# Patient Record
Sex: Female | Born: 1945 | Race: White | Hispanic: No | Marital: Single | Smoking: Never smoker
Health system: Southern US, Community
[De-identification: ages and names within clinical notes are randomized; demographics above are authoritative.]

## PROBLEM LIST (undated history)

## (undated) DIAGNOSIS — F329 Major depressive disorder, single episode, unspecified: Secondary | ICD-10-CM

## (undated) DIAGNOSIS — E079 Disorder of thyroid, unspecified: Secondary | ICD-10-CM

## (undated) DIAGNOSIS — F32A Depression, unspecified: Secondary | ICD-10-CM

## (undated) DIAGNOSIS — F419 Anxiety disorder, unspecified: Secondary | ICD-10-CM

## (undated) DIAGNOSIS — M1990 Unspecified osteoarthritis, unspecified site: Secondary | ICD-10-CM

## (undated) DIAGNOSIS — I10 Essential (primary) hypertension: Secondary | ICD-10-CM

## (undated) HISTORY — DX: Unspecified osteoarthritis, unspecified site: M19.90

## (undated) HISTORY — DX: Disorder of thyroid, unspecified: E07.9

## (undated) HISTORY — DX: Anxiety disorder, unspecified: F41.9

## (undated) HISTORY — DX: Essential (primary) hypertension: I10

---

## 2008-07-17 ENCOUNTER — Ambulatory Visit: Payer: Self-pay | Admitting: Vascular Surgery

## 2008-09-12 ENCOUNTER — Ambulatory Visit: Payer: Self-pay | Admitting: *Deleted

## 2010-12-22 NOTE — Procedures (Signed)
DUPLEX DEEP VENOUS EXAM - LOWER EXTREMITY   INDICATION:  Bilateral leg edema and pain.   HISTORY:  Edema:  Yes.  Trauma/Surgery:  No.  Pain:  Yes.  PE:  No.  Previous DVT:  No.  Anticoagulants:  None.  Other:   DUPLEX EXAM:                CFV   SFV   PopV  PTV    GSV                R  L  R  L  R  L  R   L  R  L  Thrombosis    0  0  0  0  0  0  0   0  0  0  Spontaneous   +  +  +  +  +  +  +   +  +  +  Phasic        +  +  +  +  +  +  +   +  +  +  Augmentation  +  +  +  +  +  +  +   +  +  +  Compressible  +  +  +  +  +  +  +   +  +  +  Competent     +  +  +  +  +  +  +   +  +  +   Legend:  + - yes  o - no  p - partial  D - decreased   IMPRESSION:  No evidence of DVT or reflux noted in bilateral legs.    _____________________________  P. Liliane Bade, M.D.   MG/MEDQ  D:  09/12/2008  T:  09/12/2008  Job:  [id number]

## 2010-12-22 NOTE — Consult Note (Signed)
VASCULAR SURGERY CONSULTATION   Christy Luna, Christy Luna  DOB:  [birth year]                                       09/12/2008  KGMWN#:[id number]   REFERRING PHYSICIAN:  Dr. Janace Hoard, [facility]   REFERRAL DIAGNOSIS:  Bilateral lower extremity edema.   HISTORY:  The patient is a 65 year old female who has been struggling  with severe poorly-controlled lower extremity edema for several months.  She has had several episodes of severe edema associated with weeping of  the skin and blistering.  This has been associated with cellulitis.   Her friend who is accompanying her at this time has photographs which do  reveal extensive lower extremity edema.   Venous evaluation by Doppler reveals no evidence of DVT or reflux in the  deep vessels.   The patient denies a history of known DVT or anticoagulant therapy.   PAST MEDICAL HISTORY:  Unremarkable.  Specifically negative for  diabetes, hypertension, heart disease, congestive heart failure or  previous venous thrombosis.   MEDICATIONS:  1. Lasix 20 mg q. a.m.  2. Augmentin 875 mg b.i.d.  3. Hydrocodone 500 mg q.4 h p.r.n.   ALLERGIES:  Sulfa.   SOCIAL HISTORY:  The patient is single.  She does not smoke or use  alcohol products.   REVIEW OF SYSTEMS:  This is noted for weight gain associated with fluid  retention.  Mild to moderate abdominal discomfort occasionally.  Chronic  leg pain associated with edema.   FAMILY HISTORY:  Noncontributory.   PHYSICAL EXAMINATION:  General:  A 65 year old female who appears  approximately her stated age.  No acute distress.  Alert and oriented.  Vital signs:  BP is 166/94, pulse is 84 per minute.  Lower extremity:  Exam reveals 2+ dorsalis pedis and posterior tibial pulses bilaterally.  1+ edema.  No pigmentation.  No ulceration or blistering.   IMPRESSION:  Poorly-controlled bilateral lower extremity edema.   RECOMMENDATIONS:  Continue current regimen of Lasix.  Leg elevation  with  exacerbation of edema.  Fit for venous compression stockings below knee  20-30 mmHg.   Balinda Quails, M.D.  Electronically Signed  PGH/MEDQ  D:  09/19/2008  T:  09/20/2008  Job:  [id number]   cc:   Peyton Najjar, MD

## 2011-02-22 ENCOUNTER — Encounter: Payer: Self-pay | Admitting: Surgery

## 2011-02-23 ENCOUNTER — Encounter (INDEPENDENT_AMBULATORY_CARE_PROVIDER_SITE_OTHER): Payer: Self-pay | Admitting: Vascular Surgery

## 2011-02-23 DIAGNOSIS — M7989 Other specified soft tissue disorders: Secondary | ICD-10-CM

## 2011-02-24 NOTE — Assessment & Plan Note (Signed)
OFFICE VISIT  Christy Luna, Christy Luna DOB:  [birth year]                                       02/23/2011 ZOXWR#:[id number]  Patient presents today for discussion of her lower extremity swelling. She had been seen in our office for a similar complaint in 2010.  At that time she had a formal venous duplex which showed no evidence of DVT and no evidence of reflux.  She has a long history of bilateral lower extremity edema.  This has been well controlled with compression garments.  She is here today with a caregiver and reports that she was having dermatitis prior to being more compliant with compression.  She currently is wearing panty-style bilateral compression garments.  She had resolution of the distal lower extremity swelling with knee-high compression and began sensing more swelling around her knees and thighs and was switched to panty style for this reason.  She has difficulty tolerating the panty style, saying it makes her abdomen feel uncomfortable.  She does have a history of hypertension.  She is single.  She is retired.  She does not smoke or drink alcohol.  Does have a history of premature atherosclerotic disease in her father and venous pathology in her mother.  PHYSICAL EXAMINATION:  A well-developed and well-nourished white female appearing her stated age in no acute distress.  Blood pressure is 161/91, heart rate 87, respirations 18.  She does have some skin discoloration on her feet bilaterally.  She does have palpable 1-2+ dorsalis pedis pulses bilaterally.  She does not have any swelling in her feet and ankles or calves currently.  She does have large thighs and knees, but this does appear to be more subcutaneous tissue versus edema.  I had a long discussion with patient and her caregiver present.  I do not see any significant arterial or venous pathology.  I did explain the importance for continued compression garment usage to control her  edema. I explained that I feel comfortable with either knee high, thigh high, or panty style, whichever is more comfortable for her.  She will try knee-high compression.  I explained that I do not expect that panty style would make any changes in her thighs and abdomen. She will see [place] again on an as-needed basis.    Larina Earthly, M.D. Electronically Signed  TFE/MEDQ  D:  02/23/2011  T:  02/24/2011  Job:  [id number]  cc:   Clydie Braun L. Hal Hope, M.D.

## 2011-09-01 ENCOUNTER — Ambulatory Visit: Payer: Self-pay

## 2011-09-01 DIAGNOSIS — E039 Hypothyroidism, unspecified: Secondary | ICD-10-CM

## 2011-09-01 DIAGNOSIS — I10 Essential (primary) hypertension: Secondary | ICD-10-CM

## 2011-09-01 DIAGNOSIS — E049 Nontoxic goiter, unspecified: Secondary | ICD-10-CM

## 2011-12-01 ENCOUNTER — Ambulatory Visit: Payer: Self-pay | Admitting: Family Medicine

## 2011-12-01 VITALS — BP 167/88 | HR 89 | Temp 97.4°F | Resp 18 | Ht 65.0 in | Wt 180.6 lb

## 2011-12-01 DIAGNOSIS — R300 Dysuria: Secondary | ICD-10-CM

## 2011-12-01 DIAGNOSIS — N760 Acute vaginitis: Secondary | ICD-10-CM

## 2011-12-01 DIAGNOSIS — M549 Dorsalgia, unspecified: Secondary | ICD-10-CM

## 2011-12-01 DIAGNOSIS — R600 Localized edema: Secondary | ICD-10-CM

## 2011-12-01 DIAGNOSIS — I10 Essential (primary) hypertension: Secondary | ICD-10-CM

## 2011-12-01 DIAGNOSIS — R309 Painful micturition, unspecified: Secondary | ICD-10-CM

## 2011-12-01 DIAGNOSIS — E039 Hypothyroidism, unspecified: Secondary | ICD-10-CM

## 2011-12-01 DIAGNOSIS — F411 Generalized anxiety disorder: Secondary | ICD-10-CM

## 2011-12-01 LAB — POCT UA - MICROSCOPIC ONLY
Casts, Ur, LPF, POC: NEGATIVE
Crystals, Ur, HPF, POC: NEGATIVE
RBC, urine, microscopic: NEGATIVE

## 2011-12-01 LAB — POCT URINALYSIS DIPSTICK
Blood, UA: NEGATIVE
Glucose, UA: NEGATIVE
Nitrite, UA: NEGATIVE
Urobilinogen, UA: 0.2

## 2011-12-01 MED ORDER — FLUCONAZOLE 150 MG PO TABS: ORAL_TABLET | ORAL | Status: DC

## 2011-12-01 MED ORDER — CLONAZEPAM 0.5 MG PO TABS: 0.5000 mg | ORAL_TABLET | Freq: Two times a day (BID) | ORAL | Status: DC | PRN

## 2011-12-01 MED ORDER — FUROSEMIDE 20 MG PO TABS: ORAL_TABLET | ORAL | Status: DC

## 2011-12-01 MED ORDER — LISINOPRIL 20 MG PO TABS: 20.0000 mg | ORAL_TABLET | Freq: Every day | ORAL | Status: DC

## 2011-12-01 NOTE — Progress Notes (Signed)
  Subjective:    Patient ID: Christy Luna, female    DOB: 15-Mar-1946, 66 y.o.   MRN: [id number]  HPI Patient presents to clinic for multiple medical issues  Generalized anxiety- much less anxious. Leaving the house more often. Using Klonopin BID             Vaginitis- yeast vaginitis in the past; also using liners that can be an irritant; prn dysuria  Intermittent lower back pain- caregiver states patient walks essentially on her heels.  Denies feet pain Using walker for balance and proprioception                  Review of Systems  Constitutional: Negative for fever and chills.  Genitourinary: Positive for dysuria (intermittant) and vaginal discharge (scant). Negative for urgency and flank pain.       Objective:   Physical Exam  Constitutional: She appears well-developed.  HENT:  Head: Normocephalic and atraumatic.  Neck: Neck supple.  Cardiovascular: Normal rate, regular rhythm and normal heart sounds.   Pulmonary/Chest: Effort normal and breath sounds normal.  Abdominal: Soft. Bowel sounds are normal.  Musculoskeletal: Normal range of motion.  Neurological: She is alert.  Skin:       Trace edema; wearing TED hose  Psychiatric:       Poor cognition     Results for orders placed in visit on 12/01/11  POCT URINALYSIS DIPSTICK      Component Value Range   Color, UA light yellow     Clarity, UA clear     Glucose, UA neg     Bilirubin, UA neg     Ketones, UA neg     Spec Grav, UA <=1.005     Blood, UA neg     pH, UA 5.5     Protein, UA neg     Urobilinogen, UA 0.2     Nitrite, UA neg     Leukocytes, UA small (1+)    POCT UA - MICROSCOPIC ONLY      Component Value Range   WBC, Ur, HPF, POC neg     RBC, urine, microscopic neg     Bacteria, U Microscopic neg     Mucus, UA neg     Epithelial cells, urine per micros neg     Crystals, Ur, HPF, POC neg     Casts, Ur, LPF, POC neg     Yeast, UA neg          Assessment & Plan:   1. Back pain  POCT urinalysis  dipstick, POCT UA - Microscopic Only  2. dysuria  POCT urinalysis dipstick, POCT UA - Microscopic Only  3. Hypothyroid    4. Vaginitis  fluconazole (DIFLUCAN) 150 MG tablet  5. GAD (generalized anxiety disorder)  clonazePAM (KLONOPIN) 0.5 MG tablet  6. HTN (hypertension)  lisinopril (PRINIVIL,ZESTRIL) 20 MG tablet  7. Leg edema  furosemide (LASIX) 20 MG tablet   Declines TSH at today's OV.  I encouraged patient to RTC in 3 months to repeat TSH.Currently patients TSH is 4,  ideally I would prefer TSH to be in the 2-3 range. Caregiver to arrange follow up OV.   I believe LBP is related to the mechanics of her gait. I have recommended to her caregiver [facility] and  She prefers to schedule that appointment  Herself.

## 2011-12-02 ENCOUNTER — Telehealth: Payer: Self-pay

## 2011-12-02 DIAGNOSIS — F411 Generalized anxiety disorder: Secondary | ICD-10-CM

## 2011-12-02 MED ORDER — CLONAZEPAM 0.5 MG PO TABS: 0.5000 mg | ORAL_TABLET | Freq: Two times a day (BID) | ORAL | Status: DC | PRN

## 2011-12-02 NOTE — Telephone Encounter (Signed)
Spoke with pt advised RX sent to [facility] [address]. Pt understood

## 2011-12-02 NOTE — Telephone Encounter (Signed)
Please advise on this.  

## 2011-12-02 NOTE — Telephone Encounter (Signed)
.  umfc The patient called regarding her klonopin rx.  Patient states that her Rx was written for 30 pills instead of 60 pills.  The patient is very upset that she only has a 15 day supply and not 30 days. Please call patient at [phone].

## 2011-12-04 ENCOUNTER — Ambulatory Visit: Payer: Self-pay | Admitting: Family Medicine

## 2011-12-04 VITALS — BP 122/80 | HR 92 | Temp 98.0°F | Resp 16

## 2011-12-04 DIAGNOSIS — E039 Hypothyroidism, unspecified: Secondary | ICD-10-CM

## 2011-12-04 DIAGNOSIS — I10 Essential (primary) hypertension: Secondary | ICD-10-CM

## 2011-12-04 DIAGNOSIS — I7300 Raynaud's syndrome without gangrene: Secondary | ICD-10-CM

## 2011-12-04 DIAGNOSIS — F411 Generalized anxiety disorder: Secondary | ICD-10-CM

## 2011-12-04 LAB — POCT CBC
Granulocyte percent: 77.8 %G (ref 37–80)
MCH, POC: 27.2 pg (ref 27–31.2)
MCV: 84.3 fL (ref 80–97)
MID (cbc): 0.6 (ref 0–0.9)
MPV: 10.9 fL (ref 0–99.8)
POC LYMPH PERCENT: 14.3 %L (ref 10–50)
POC MID %: 7.9 %M (ref 0–12)
Platelet Count, POC: 226 10*3/uL (ref 142–424)
RDW, POC: 14.7 %
WBC: 7.8 10*3/uL (ref 4.6–10.2)

## 2011-12-04 MED ORDER — VERAPAMIL HCL 40 MG PO TABS: 40.0000 mg | ORAL_TABLET | Freq: Every day | ORAL | Status: DC

## 2011-12-04 NOTE — Progress Notes (Signed)
  Subjective:    Patient ID: Christy Luna, female    DOB: 1945/10/20, 66 y.o.   MRN: [id number]  HPI  Patient presents to clinic with her care giver, Christy Luna, concerned about pururitic appearance to feet. Patient denies pain involving hands/feet.  Care giver did place clobetasol on patients skin which she stated helped erythema.  Known Raynaud's and venous stasis.   Unable to tolerate calcium channel blockers in the past; wears compression hose daily.  Skin changes initially noted in 2009; lower arterial dopplers were normal (Dr. Arbie Cookey); LE venous doppler were Negative for DVT or reflux in (B) LE in 2010 (Dr. Madilyn Fireman); Raynaud's intermittently symptomatic  Dr. Margo Aye involved in care of venous stasis with a variety of treatment strategies.  Patient has significant anxiety and OCD and has been very fearful of medications and interventions in the past Patient over the last year has accepted Klonipin which has significantly reduced anxiety(adamant against SSRI)  Review of Systems     Objective:   Physical Exam  Constitutional: She appears well-developed.  HENT:  Head: Normocephalic and atraumatic.  Neck: Neck supple.  Cardiovascular: Normal rate, regular rhythm and normal heart sounds.  Exam reveals decreased pulses.        Trace dorsalis pedal pulses (B)  Pulmonary/Chest: Effort normal and breath sounds normal.  Neurological: She is alert.  Skin:     Psychiatric:       anxious    CBC-WNL    Assessment & Plan:   1. Raynaud's disease  POCT CBC, ANA, Sedimentation Rate, verapamil (CALAN) 40 MG tablet  2. GAD (generalized anxiety disorder)    3. Hypothyroid    4. HTN (hypertension)       Current does of Verapamil sub therapeutic however I'm hoping to reduce patients anxiety by introducing low dose of Verapamil  and slowly titrating to a therapeutic dose.  Please note that patient's caregiver became very labile during the evaluation insisting patient had cellulitis  given the initial red appearance  to her skin. Caregiver will e mail pictures taken yesterday of patients skin(to be scanned into chart along with pictures from today's OV) Spent over 60 minutes with patient and caregiver in effort to reassure the caregiver that the patient did not have cellulitis and the skin changes noted were secondary to  Raynaud's and venous stasis.  After this discussion the caregiver turned to the patient and stated that she would no longer be able to care for her if her skin began to weep. Caregiver states after her skin came into contact with the serous fluid in the past she developed a contagious ??dermatitis  and spread it on to her daughter.   My assistant present for the entire OV was [name]  Erin C. was available to assist for a large portion of the visit as well.

## 2011-12-05 ENCOUNTER — Telehealth: Payer: Self-pay

## 2011-12-05 NOTE — Telephone Encounter (Signed)
PATIENT HAS QUESTIONS ABOUT PRESCRIPTION AND ITS SIDE EFFECTS THAT DR. Hal Hope PRESCRIBED HER.  PLEASE CALL

## 2011-12-06 NOTE — Telephone Encounter (Signed)
Spoke with patient and let her know that it is fine taking both meds and keep an eye on blood pressure.

## 2011-12-06 NOTE — Telephone Encounter (Signed)
Patient is wanting to know if 40mg  of varapamil ok to take with lisinopril.  Concerned that she is taking too much blood pressure medicine.  Please advise.

## 2011-12-06 NOTE — Telephone Encounter (Signed)
It is ok to take Calan with Lisinopril.  There is no interaction.  Watch blood pressure if possible at home.  Notify us if she experiences dizziness as this can be a symptom of hypotension.

## 2011-12-06 NOTE — Telephone Encounter (Signed)
No answer or answering machine.

## 2012-02-01 ENCOUNTER — Ambulatory Visit: Payer: Self-pay | Admitting: Family Medicine

## 2012-02-01 ENCOUNTER — Encounter: Payer: Self-pay | Admitting: Family Medicine

## 2012-02-01 VITALS — BP 140/84 | HR 79 | Temp 97.4°F | Resp 16 | Ht 64.5 in | Wt 180.0 lb

## 2012-02-01 DIAGNOSIS — E039 Hypothyroidism, unspecified: Secondary | ICD-10-CM

## 2012-02-01 DIAGNOSIS — R5381 Other malaise: Secondary | ICD-10-CM

## 2012-02-01 DIAGNOSIS — G8929 Other chronic pain: Secondary | ICD-10-CM

## 2012-02-01 DIAGNOSIS — R609 Edema, unspecified: Secondary | ICD-10-CM

## 2012-02-01 NOTE — Progress Notes (Signed)
HPI  Patient presents to clinic with her care giver, Christy Luna, concerned about taking too many medications.  Patient denies pain involving hands/feet. Care giver did place clobetasol on patients skin which she stated helped erythema.  The rash has completely cleared.  Known Raynaud's and venous stasis.   wears compression hose daily.  Skin changes initially noted in 2009; lower arterial dopplers were normal (Dr. Arbie Cookey); LE venous doppler were  Negative for DVT or reflux in (B) LE in 2010 (Dr. Madilyn Fireman); Raynaud's intermittently symptomatic  Dr. Margo Aye involved in care of venous stasis with a variety of treatment strategies.  Patient has significant anxiety and OCD and has been very fearful of medications and interventions in the past  Patient over the last year has accepted Klonipin which has significantly reduced anxiety(adamant against SSRI)  Objective:   Results for orders placed in visit on 12/04/11  POCT CBC      Component Value Range   WBC 7.8  4.6 - 10.2 K/uL   Lymph, poc 1.1  0.6 - 3.4   POC LYMPH PERCENT 14.3  10 - 50 %L   MID (cbc) 0.6  0 - 0.9   POC MID % 7.9  0 - 12 %M   POC Granulocyte 6.1  2 - 6.9   Granulocyte percent 77.8  37 - 80 %G   RBC 4.37  4.04 - 5.48 M/uL   Hemoglobin 11.9 (*) 12.2 - 16.2 g/dL   HCT, POC 47.8 (*) 29.5 - 47.9 %   MCV 84.3  80 - 97 fL   MCH, POC 27.2  27 - 31.2 pg   MCHC 32.3  31.8 - 35.4 g/dL   RDW, POC 62.1     Platelet Count, POC 226  142 - 424 K/uL   MPV 10.9  0 - 99.8 fL  ANA      Component Value Range   ANA POS (*) NEGATIVE  SEDIMENTATION RATE      Component Value Range   Sed Rate 21  0 - 22 mm/hr  ANTI-NUCLEAR AB-TITER (ANA TITER)      Component Value Range   ANA Titer 1 1:160 (*) <1:40     ANA Pattern 1 SPECKLED (*)     Chest:  Bibasilar rales Heart: reg with I/VI systolic murmur Abdomen:  Protuberant  Assessment:  Uncertain about thyroid status.  Multiple complaints that can be referrable to lupus.   The fluid retention  may be a problem stemming from verapamil.  Plan:  Stop the verapamil Check thyroid TSH

## 2012-02-02 LAB — TSH: TSH: 3.562 u[IU]/mL (ref 0.350–4.500)

## 2012-02-28 ENCOUNTER — Telehealth: Payer: Self-pay

## 2012-02-28 ENCOUNTER — Other Ambulatory Visit: Payer: Self-pay | Admitting: Emergency Medicine

## 2012-02-29 ENCOUNTER — Other Ambulatory Visit: Payer: Self-pay | Admitting: Family Medicine

## 2012-03-09 ENCOUNTER — Ambulatory Visit (INDEPENDENT_AMBULATORY_CARE_PROVIDER_SITE_OTHER): Payer: Self-pay | Admitting: Family Medicine

## 2012-03-09 ENCOUNTER — Encounter: Payer: Self-pay | Admitting: Family Medicine

## 2012-03-09 VITALS — BP 121/71 | HR 91 | Temp 97.7°F | Resp 18 | Wt 178.0 lb

## 2012-03-09 DIAGNOSIS — R600 Localized edema: Secondary | ICD-10-CM

## 2012-03-09 DIAGNOSIS — F411 Generalized anxiety disorder: Secondary | ICD-10-CM

## 2012-03-09 DIAGNOSIS — R609 Edema, unspecified: Secondary | ICD-10-CM

## 2012-03-09 DIAGNOSIS — M79609 Pain in unspecified limb: Secondary | ICD-10-CM

## 2012-03-09 LAB — COMPREHENSIVE METABOLIC PANEL
ALT: 11 U/L (ref 0–35)
AST: 18 U/L (ref 0–37)
Albumin: 4.3 g/dL (ref 3.5–5.2)
Alkaline Phosphatase: 74 U/L (ref 39–117)
BUN: 18 mg/dL (ref 6–23)
CO2: 26 mEq/L (ref 19–32)
Calcium: 9.5 mg/dL (ref 8.4–10.5)
Chloride: 100 mEq/L (ref 96–112)
Creat: 1.08 mg/dL (ref 0.50–1.10)
Glucose, Bld: 92 mg/dL (ref 70–99)
Potassium: 4.3 mEq/L (ref 3.5–5.3)
Sodium: 135 mEq/L (ref 135–145)
Total Bilirubin: 0.5 mg/dL (ref 0.3–1.2)
Total Protein: 6.8 g/dL (ref 6.0–8.3)

## 2012-03-09 MED ORDER — CLONAZEPAM 0.5 MG PO TABS: 0.5000 mg | ORAL_TABLET | Freq: Two times a day (BID) | ORAL | Status: DC | PRN

## 2012-03-09 NOTE — Progress Notes (Signed)
66 yo woman with leg pains and edema.  Chronic nausea for over 3 years.  O:  Seen with daughter and granddaughter 2+ lower extremity edema bilaterally No muscle wasting, normal leg ROM Chest:  Clear Heart: reg, no murmur Skin: no rash Using walker Results for orders placed in visit on 02/01/12  TSH      Component Value Range   TSH 3.562  0.350 - 4.500 uIU/mL   A: I think that the leg pains are related to the edema.  We spent a good deal of time (25 min) face to face discussing fluid intake, potassium, and sodium.  Diet was reviewed.   P:  Check potassium.  1. Pedal edema  Comprehensive metabolic panel  2. GAD (generalized anxiety disorder)  clonazePAM (KLONOPIN) 0.5 MG tablet

## 2012-06-11 ENCOUNTER — Other Ambulatory Visit: Payer: Self-pay | Admitting: Family Medicine

## 2012-08-20 ENCOUNTER — Telehealth: Payer: Self-pay

## 2012-08-20 DIAGNOSIS — R600 Localized edema: Secondary | ICD-10-CM

## 2012-08-20 NOTE — Telephone Encounter (Signed)
Pt would like to know if having blood work is required  in order to receive a refill on levothyroxine. Please advise.  Best# [phone]

## 2012-08-21 ENCOUNTER — Telehealth: Payer: Self-pay

## 2012-08-21 MED ORDER — LEVOTHYROXINE SODIUM 25 MCG PO TABS: 25.0000 ug | ORAL_TABLET | Freq: Every day | ORAL | Status: DC

## 2012-08-21 MED ORDER — LISINOPRIL 20 MG PO TABS: 20.0000 mg | ORAL_TABLET | Freq: Every day | ORAL | Status: DC

## 2012-08-21 MED ORDER — FUROSEMIDE 20 MG PO TABS: ORAL_TABLET | ORAL | Status: DC

## 2012-08-21 NOTE — Telephone Encounter (Signed)
Refills sent in

## 2012-08-21 NOTE — Telephone Encounter (Signed)
Tried to call pt and her line was busy x 2. Will try to call her back again later.

## 2012-08-21 NOTE — Telephone Encounter (Signed)
I have advised she is due in June for this. She states she is having problems now with other problems. She is worried about waiting for this. I  Have advised her it is okay for her have labs now and okay for her to wait if she chooses. She wants to come in for the blood work. I have advised her this is fine. She is provided Dr Tommi Rumps office hours.

## 2012-08-21 NOTE — Telephone Encounter (Signed)
Patient does not understand why we refilled when she has not had her blood drawn in six months.    Best#: [phone]

## 2012-08-21 NOTE — Telephone Encounter (Signed)
Pt is wanting to know if we can refill the rest of medicine to the pharmacy.   Best#: [phone]

## 2012-08-21 NOTE — Telephone Encounter (Signed)
Patient wants to know if she can get refills on the Lisinopril and Lasix. I have had two very lengthy phone conversations with patient. She is requesting meds to be sent in until she can come in to see Dr Milus Glazier, she is given his hours and they do not make her happy. Can we give her an extra month until she can arrange her schedule enough to come in?

## 2012-08-21 NOTE — Telephone Encounter (Signed)
I have sent a 6 month supply to her pharmacy - she will need OV with labs before this runs out in June.

## 2012-08-21 NOTE — Telephone Encounter (Signed)
Pt's telephone is still busy.

## 2012-08-24 ENCOUNTER — Telehealth: Payer: Self-pay | Admitting: Radiology

## 2012-08-24 DIAGNOSIS — F411 Generalized anxiety disorder: Secondary | ICD-10-CM

## 2012-08-24 MED ORDER — CLONAZEPAM 0.5 MG PO TABS: 0.5000 mg | ORAL_TABLET | Freq: Two times a day (BID) | ORAL | Status: DC | PRN

## 2012-08-24 NOTE — Telephone Encounter (Signed)
Please advise on Klonopin refill/ she has appt with you in March, requests supply until her appt.

## 2012-08-24 NOTE — Telephone Encounter (Signed)
I have called her to advise. [facility]. Amy

## 2012-08-24 NOTE — Telephone Encounter (Signed)
I called in the klonipin to patient's pharmacy.

## 2012-08-31 ENCOUNTER — Other Ambulatory Visit: Payer: Self-pay | Admitting: Physician Assistant

## 2012-09-19 ENCOUNTER — Telehealth: Payer: Self-pay | Admitting: *Deleted

## 2012-09-19 DIAGNOSIS — R600 Localized edema: Secondary | ICD-10-CM

## 2012-09-19 MED ORDER — FUROSEMIDE 20 MG PO TABS: ORAL_TABLET | ORAL | Status: DC

## 2012-09-19 NOTE — Telephone Encounter (Signed)
[facility] [address] requesting refill on furosemide 20mg .  Last fill on 08/19/12

## 2012-09-19 NOTE — Telephone Encounter (Signed)
Done

## 2012-10-04 ENCOUNTER — Encounter: Payer: Self-pay | Admitting: Emergency Medicine

## 2012-10-04 ENCOUNTER — Ambulatory Visit: Payer: Medicare Other | Admitting: Emergency Medicine

## 2012-10-04 VITALS — BP 166/91 | HR 87 | Temp 97.3°F | Resp 18 | Ht 65.0 in | Wt 183.0 lb

## 2012-10-04 DIAGNOSIS — I7300 Raynaud's syndrome without gangrene: Secondary | ICD-10-CM

## 2012-10-04 DIAGNOSIS — E039 Hypothyroidism, unspecified: Secondary | ICD-10-CM

## 2012-10-04 DIAGNOSIS — R609 Edema, unspecified: Secondary | ICD-10-CM

## 2012-10-04 NOTE — Progress Notes (Signed)
[facility] [address], [place] [address] [phone]  Date:  10/04/2012   Name:  Christy Luna   DOB:  10/27/1945   MRN:  [id number]  PCP:  Dois Davenport., MD    Chief Complaint: Dizziness, Numbness, Foot Swelling and Rash   History of Present Illness:  Christy Luna is a 67 y.o. very pleasant female patient who presents with the following:  Has increased peripheral edema.  She was out in the snow a week or so ago and lay in the cold until 911 arrived to help her up and back in the house following a fall.  She has had violaceous discoloration of her toes since than.  She is not compliant with her medications, taking lasix far less frequently than suggested.  She is concerned that she is not drinking sufficient water to make the lasix safe.  She denies any injury from her fall.  There is no problem list on file for this patient.   Past Medical History  Diagnosis Date  . Arthritis   . Thyroid disease   . Anxiety   . Hypertension     History reviewed. No pertinent past surgical history.  History  Substance Use Topics  . Smoking status: Never Smoker   . Smokeless tobacco: Not on file  . Alcohol Use: No    Family History  Problem Relation Age of Onset  . Heart disease Father   . Heart disease Brother   . Arthritis Brother   . Tuberculosis Maternal Grandmother   . Heart disease Maternal Grandfather   . Heart disease Paternal Grandmother   . Cancer Paternal Grandfather     Allergies  Allergen Reactions  . Augmentin (Amoxicillin-Pot Clavulanate)     Is able to take in small doses.  [facility] Cross Reactors     Medication list has been reviewed and updated.  Current Outpatient Prescriptions on File Prior to Visit  Medication Sig Dispense Refill  . clobetasol cream (TEMOVATE) 0.05 % Apply topically.      . clonazePAM (KLONOPIN) 0.5 MG tablet Take 1 tablet (0.5 mg total) by mouth 2 (two) times daily as needed.  60 tablet  2  .  furosemide (LASIX) 20 MG tablet 1 twice daily as needed  60 tablet  0  . levothyroxine (SYNTHROID, LEVOTHROID) 25 MCG tablet Take 1 tablet (25 mcg total) by mouth daily.  90 tablet  1  . lisinopril (PRINIVIL,ZESTRIL) 20 MG tablet Take 1 tablet (20 mg total) by mouth daily.  30 tablet  0   No current facility-administered medications on file prior to visit.    Review of Systems:  As per HPI, otherwise negative.    Physical Examination: Filed Vitals:   10/04/12 1734  BP: 166/91  Pulse: 87  Temp: 97.3 F (36.3 C)  Resp: 18   Filed Vitals:   10/04/12 1734  Height: 5\' 5"  (1.651 m)  Weight: 183 lb (83.008 kg)   Body mass index is 30.45 kg/(m^2). Ideal Body Weight: Weight in (lb) to have BMI = 25: 149.9  GEN: WDWN, NAD, Non-toxic, A & O x 3 HEENT: Atraumatic, Normocephalic. Neck supple. No masses, No LAD. Ears and Nose: No external deformity. CV: RRR, No M/G/R. No JVD. No thrill. No extra heart sounds. PULM: CTA B, no wheezes, crackles, rhonchi. No retractions. No resp. distress. No accessory muscle use. ABD: S, NT, ND, +BS. No rebound. No HSM. EXTR: marked edema to mid calf with violaceous discoloration forefoot  and toes.  Fingers also violaceous and cold. NEURO Normal gait.  PSYCH: Normally interactive. Conversant. Not depressed or anxious appearing.  Calm demeanor.    Assessment and Plan: Hypothyroidism Peripheral edema Raynaud's syndrome Follow up with DR Lauenstein in two weeks Increase lasix to 40 a day in the morning. Keep hands and feet warm  Carmelina Dane, MD

## 2012-10-07 ENCOUNTER — Telehealth: Payer: Self-pay

## 2012-10-07 NOTE — Telephone Encounter (Signed)
Patient should drink when she is thirsty.  She is fluid overloaded which is the reason she is on lasix!  Please let her know. Thanks

## 2012-10-07 NOTE — Telephone Encounter (Signed)
Patient would like to know how much water she will need to drink while taking Furosemide 20mg  medication. She states pharmacy told her to drink 2 liters. Please call back at [phone]. Thanks

## 2012-10-07 NOTE — Telephone Encounter (Signed)
Dr. Dareen Piano how much water would you like for your patient to drink?

## 2012-10-09 ENCOUNTER — Telehealth: Payer: Self-pay

## 2012-10-09 DIAGNOSIS — E039 Hypothyroidism, unspecified: Secondary | ICD-10-CM

## 2012-10-09 DIAGNOSIS — F411 Generalized anxiety disorder: Secondary | ICD-10-CM

## 2012-10-09 DIAGNOSIS — I10 Essential (primary) hypertension: Secondary | ICD-10-CM

## 2012-10-09 DIAGNOSIS — I7300 Raynaud's syndrome without gangrene: Secondary | ICD-10-CM

## 2012-10-09 NOTE — Telephone Encounter (Signed)
Please advise on Clobetasol refill

## 2012-10-09 NOTE — Telephone Encounter (Signed)
There's no documentation that clobetasol cream was discussed.  What does she use this for?

## 2012-10-09 NOTE — Telephone Encounter (Signed)
I have advised her of this.

## 2012-10-09 NOTE — Telephone Encounter (Signed)
Patient called in and stated she was here last week on Wednesday and was seen.  She says that she had not heard from the pharmacy regarding her clobetasol cream and she called them.  They told her that they had not received anything from out office and suggested she call us about the prescription.  Please call [phone].

## 2012-10-10 MED ORDER — CLOBETASOL PROPIONATE 0.05 % EX CREA: TOPICAL_CREAM | Freq: Every day | CUTANEOUS | Status: DC

## 2012-10-10 NOTE — Telephone Encounter (Signed)
Patient advised.

## 2012-10-10 NOTE — Telephone Encounter (Signed)
She uses this for dermatitis legs and arms. She uses it after bathing.

## 2012-10-10 NOTE — Telephone Encounter (Signed)
Rx sent to pharmacy. She cannot use this for more than 2 weeks at a time without taking a break

## 2012-10-19 ENCOUNTER — Encounter: Payer: Self-pay | Admitting: Family Medicine

## 2012-10-19 ENCOUNTER — Ambulatory Visit: Payer: Medicare Other | Admitting: Family Medicine

## 2012-10-19 VITALS — BP 126/73 | HR 85 | Temp 97.4°F | Resp 16 | Wt 186.0 lb

## 2012-10-19 DIAGNOSIS — H01139 Eczematous dermatitis of unspecified eye, unspecified eyelid: Secondary | ICD-10-CM

## 2012-10-19 DIAGNOSIS — R600 Localized edema: Secondary | ICD-10-CM

## 2012-10-19 DIAGNOSIS — F411 Generalized anxiety disorder: Secondary | ICD-10-CM

## 2012-10-19 DIAGNOSIS — E039 Hypothyroidism, unspecified: Secondary | ICD-10-CM

## 2012-10-19 DIAGNOSIS — R609 Edema, unspecified: Secondary | ICD-10-CM

## 2012-10-19 DIAGNOSIS — I10 Essential (primary) hypertension: Secondary | ICD-10-CM

## 2012-10-19 DIAGNOSIS — I7300 Raynaud's syndrome without gangrene: Secondary | ICD-10-CM

## 2012-10-19 LAB — CBC WITH DIFFERENTIAL/PLATELET
Basophils Absolute: 0.1 10*3/uL (ref 0.0–0.1)
Basophils Relative: 1 % (ref 0–1)
Eosinophils Absolute: 0.4 10*3/uL (ref 0.0–0.7)
Eosinophils Relative: 4 % (ref 0–5)
HCT: 36.2 % (ref 36.0–46.0)
Hemoglobin: 12.8 g/dL (ref 12.0–15.0)
Lymphocytes Relative: 14 % (ref 12–46)
Lymphs Abs: 1.2 10*3/uL (ref 0.7–4.0)
MCH: 27.4 pg (ref 26.0–34.0)
MCHC: 35.4 g/dL (ref 30.0–36.0)
MCV: 77.4 fL — ABNORMAL LOW (ref 78.0–100.0)
Monocytes Absolute: 0.8 10*3/uL (ref 0.1–1.0)
Monocytes Relative: 9 % (ref 3–12)
Neutro Abs: 6.7 10*3/uL (ref 1.7–7.7)
Neutrophils Relative %: 72 % (ref 43–77)
Platelets: 297 10*3/uL (ref 150–400)
RBC: 4.68 MIL/uL (ref 3.87–5.11)
RDW: 14.3 % (ref 11.5–15.5)
WBC: 9.2 10*3/uL (ref 4.0–10.5)

## 2012-10-19 LAB — COMPREHENSIVE METABOLIC PANEL
ALT: 14 U/L (ref 0–35)
AST: 21 U/L (ref 0–37)
Albumin: 4.5 g/dL (ref 3.5–5.2)
Alkaline Phosphatase: 87 U/L (ref 39–117)
BUN: 26 mg/dL — ABNORMAL HIGH (ref 6–23)
CO2: 27 mEq/L (ref 19–32)
Calcium: 9.5 mg/dL (ref 8.4–10.5)
Chloride: 100 mEq/L (ref 96–112)
Creat: 0.97 mg/dL (ref 0.50–1.10)
Glucose, Bld: 94 mg/dL (ref 70–99)
Potassium: 4.2 mEq/L (ref 3.5–5.3)
Sodium: 137 mEq/L (ref 135–145)
Total Bilirubin: 0.5 mg/dL (ref 0.3–1.2)
Total Protein: 7.5 g/dL (ref 6.0–8.3)

## 2012-10-19 MED ORDER — LEVOTHYROXINE SODIUM 25 MCG PO TABS: 25.0000 ug | ORAL_TABLET | Freq: Every day | ORAL | Status: DC

## 2012-10-19 MED ORDER — CLOBETASOL PROPIONATE 0.05 % EX CREA: TOPICAL_CREAM | Freq: Every day | CUTANEOUS | Status: DC

## 2012-10-19 MED ORDER — METHYLPREDNISOLONE ACETATE 80 MG/ML IJ SUSP
80.0000 mg | Freq: Once | INTRAMUSCULAR | Status: AC
  Administered 2012-10-19: 80 mg via INTRAMUSCULAR

## 2012-10-19 MED ORDER — CLONAZEPAM 0.5 MG PO TABS: 0.5000 mg | ORAL_TABLET | Freq: Two times a day (BID) | ORAL | Status: DC | PRN

## 2012-10-19 MED ORDER — CHLORTHALIDONE 25 MG PO TABS: 25.0000 mg | ORAL_TABLET | Freq: Every day | ORAL | Status: DC

## 2012-10-19 NOTE — Progress Notes (Signed)
67 yo woman with marked Raynaud's who has been evaluated by the vascular surgeons in the past.  The feet have become swollen and sore despite the lasix.  Also has flare of eczema for which Dr. Margo Aye has prescribed clobetasol.  Objective:  NAD All toes are red, cool and somewhat violaceous.  No palpable pedal pulses. Diffuse hand eczema also affecting dorsal neck. Chest:  Clear Heart:  Reg, I/VI systolic ejection murmur. Results for orders placed in visit on 03/09/12  COMPREHENSIVE METABOLIC PANEL      Result Value Range   Sodium 135  135 - 145 mEq/L   Potassium 4.3  3.5 - 5.3 mEq/L   Chloride 100  96 - 112 mEq/L   CO2 26  19 - 32 mEq/L   Glucose, Bld 92  70 - 99 mg/dL   BUN 18  6 - 23 mg/dL   Creat 1.61  0.96 - 0.45 mg/dL   Total Bilirubin 0.5  0.3 - 1.2 mg/dL   Alkaline Phosphatase 74  39 - 117 U/L   AST 18  0 - 37 U/L   ALT 11  0 - 35 U/L   Total Protein 6.8  6.0 - 8.3 g/dL   Albumin 4.3  3.5 - 5.2 g/dL   Calcium 9.5  8.4 - 40.9 mg/dL   Assessment:  Raynaud's, eczema  Plan:  Eczematous dermatitis of eyelid, unspecified laterality - Plan: methylPREDNISolone acetate (DEPO-MEDROL) injection 80 mg, clobetasol cream (TEMOVATE) 0.05 %, CBC with Differential, Comprehensive metabolic panel, POCT SEDIMENTATION RATE  Raynaud's disease - Plan: methylPREDNISolone acetate (DEPO-MEDROL) injection 80 mg, POCT SEDIMENTATION RATE  GAD (generalized anxiety disorder) - Plan: clonazePAM (KLONOPIN) 0.5 MG tablet  Hypothyroid - Plan: levothyroxine (SYNTHROID, LEVOTHROID) 25 MCG tablet  HTN (hypertension) - Plan: chlorthalidone (HYGROTON) 25 MG tablet, Comprehensive metabolic panel  Pedal edema consider switch to Demadex but she needs to think about change from Lasix  I spent over one half hour with the patient and her daughter and granddaughter. Patient is very slow to make up her mind what changes she will except to correctly pedal edema, so there were great pauses in the interaction.

## 2012-10-20 LAB — SEDIMENTATION RATE: Sed Rate: 25 mm/hr — ABNORMAL HIGH (ref 0–22)

## 2012-12-08 ENCOUNTER — Other Ambulatory Visit: Payer: Self-pay | Admitting: Physician Assistant

## 2012-12-25 ENCOUNTER — Other Ambulatory Visit: Payer: Self-pay | Admitting: Family Medicine

## 2012-12-25 NOTE — Telephone Encounter (Signed)
Pt states [facility] ([facility] [address]) has sent refill request for clonazepam, electronically early this morning and again just now. She is VERY upset because she needs rx to be refilled by the morning because she does not drive and she has someone available to go to the pharmacy for her tomorrow.  Pt wants [place] to address this issue tonight

## 2012-12-26 NOTE — Telephone Encounter (Signed)
I had sent back a [facility] yesterday when requested w/comment that a Rx had been sent on 10/19/12 #60 w/5RFs. I called them this morning after getting message from pt and [facility] reported that [facility] do not [facility] a [facility] of  This previous Rx. I called in the remainder of the RFs (#60 w/3RFs) that Dr Elbert Ewings wrote in March. Notified and explained to pt who thanked me for checking for her.

## 2013-01-22 ENCOUNTER — Other Ambulatory Visit: Payer: Self-pay | Admitting: Family Medicine

## 2013-02-16 ENCOUNTER — Ambulatory Visit (INDEPENDENT_AMBULATORY_CARE_PROVIDER_SITE_OTHER): Payer: BC Managed Care – PPO | Admitting: Family Medicine

## 2013-02-16 VITALS — BP 128/86 | HR 77 | Temp 97.6°F | Resp 16 | Ht 65.0 in | Wt 173.8 lb

## 2013-02-16 DIAGNOSIS — I878 Other specified disorders of veins: Secondary | ICD-10-CM | POA: Insufficient documentation

## 2013-02-16 DIAGNOSIS — R634 Abnormal weight loss: Secondary | ICD-10-CM

## 2013-02-16 DIAGNOSIS — F411 Generalized anxiety disorder: Secondary | ICD-10-CM

## 2013-02-16 DIAGNOSIS — E039 Hypothyroidism, unspecified: Secondary | ICD-10-CM

## 2013-02-16 DIAGNOSIS — I7300 Raynaud's syndrome without gangrene: Secondary | ICD-10-CM | POA: Insufficient documentation

## 2013-02-16 DIAGNOSIS — K921 Melena: Secondary | ICD-10-CM

## 2013-02-16 DIAGNOSIS — I499 Cardiac arrhythmia, unspecified: Secondary | ICD-10-CM

## 2013-02-16 DIAGNOSIS — K5900 Constipation, unspecified: Secondary | ICD-10-CM

## 2013-02-16 DIAGNOSIS — R350 Frequency of micturition: Secondary | ICD-10-CM

## 2013-02-16 LAB — IFOBT (OCCULT BLOOD): IFOBT: POSITIVE

## 2013-02-16 LAB — POCT URINALYSIS DIPSTICK
Glucose, UA: NEGATIVE
Protein, UA: NEGATIVE
Urobilinogen, UA: 0.2

## 2013-02-16 MED ORDER — SERTRALINE HCL 50 MG PO TABS: ORAL_TABLET | ORAL | Status: DC

## 2013-02-16 NOTE — Progress Notes (Signed)
Subjective:    Patient ID: Christy Luna, female    DOB: 28-Dec-1945, 67 y.o.   MRN: [id number]  HPI  This 67 y.o.Cauc female is here with 2 family members who state pt is having significant   anxiety. She is calling them several times a day and not sleeping well. Current anxiolytic is not  effective. Appetite is fair and pt has lost weight. Niece reports pt "is very OCD" but has never  been prescribed daily medication for this problem.  Pt has thyroid disease and has chronic  constipation; has never had colonoscopy. Pt has known Raynaud's Disease with chronic  swelling in lower extremities and muscle cramps.   Patient Active Problem List   Diagnosis Date Noted  . Generalized anxiety disorder 02/16/2013  . Unspecified hypothyroidism 02/16/2013  . Raynaud's disease 02/16/2013  . Lower extremity venous stasis 02/16/2013    PMHx, Soc Hx and Fam Hx reviewed.    Review of Systems  Constitutional: Positive for activity change, appetite change, fatigue and unexpected weight change. Negative for fever, chills and diaphoresis.  HENT: Negative for drooling, trouble swallowing and dental problem.   Eyes: Negative.   Respiratory: Negative for cough, choking, chest tightness, shortness of breath and wheezing.   Cardiovascular: Positive for leg swelling. Negative for chest pain and palpitations.  Gastrointestinal: Positive for abdominal pain and constipation. Negative for nausea, vomiting, diarrhea, blood in stool and anal bleeding.  Endocrine: Negative.   Genitourinary: Positive for urgency, frequency and difficulty urinating. Negative for vaginal bleeding and pelvic pain.  Musculoskeletal: Positive for myalgias and arthralgias.  Skin: Positive for color change.  Allergic/Immunologic: Negative.   Neurological: Negative.   Psychiatric/Behavioral: Positive for confusion, sleep disturbance and agitation. The patient is nervous/anxious.        Objective:   Physical Exam  Nursing note and  vitals reviewed. Constitutional: She is oriented to person, place, and time. She appears well-developed and well-nourished. No distress.  HENT:  Head: Normocephalic and atraumatic.  Right Ear: External ear normal.  Left Ear: External ear normal.  Oroph- dry mucosa w/ shallow ulcerations on lips.  Neck: Normal range of motion. Neck supple. No thyromegaly present.  Cardiovascular: Normal rate, regular rhythm, S1 normal, S2 normal and normal heart sounds.   Occasional extrasystoles are present. PMI is not displaced.  Exam reveals no gallop and no friction rub.   No murmur heard. Pulmonary/Chest: Effort normal and breath sounds normal. No respiratory distress. She has no wheezes.  Abdominal: Soft. Normal appearance. She exhibits no distension, no pulsatile midline mass and no mass. Bowel sounds are decreased. There is no hepatosplenomegaly. There is tenderness in the right lower quadrant. There is no rigidity, no rebound, no guarding, no CVA tenderness and no tenderness at McBurney's point.  Genitourinary: Rectum normal. Rectal exam shows no external hemorrhoid, no mass, no tenderness and anal tone normal.  Musculoskeletal:  Stiff movements.  Lymphadenopathy:    She has no cervical adenopathy.  Neurological: She is alert and oriented to person, place, and time. No cranial nerve deficit. She exhibits abnormal muscle tone. Coordination normal.  Skin: Skin is warm and dry.  Psychiatric: Thought content normal. Her mood appears anxious. Her affect is blunt. Her affect is not angry, not labile and not inappropriate. Her speech is delayed. Her speech is not rapid and/or pressured. She is slowed. She is not agitated, not withdrawn and not combative. Cognition and memory are impaired. She does not express impulsivity or inappropriate judgment. She is communicative. She is attentive.  Results for orders placed in visit on 02/16/13  POCT URINALYSIS DIPSTICK      Result Value Range   Color, UA yellow      Clarity, UA clear     Glucose, UA neg     Bilirubin, UA neg     Ketones, UA neg     Spec Grav, UA 1.015     Blood, UA trace     pH, UA 7.5     Protein, UA neg     Urobilinogen, UA 0.2     Nitrite, UA neg     Leukocytes, UA small (1+)    IFOBT (OCCULT BLOOD)      Result Value Range   IFOBT Positive      ECG: NSR w/ regular PVCs every 5-6 beats. LVH by voltage.      Assessment & Plan:  Generalized anxiety disorder- Documented in office notes for > 1 year; pt has declined SSRIs in the past. Today, she agrees to try low dose Sertraline- Start at 50 mg  1/2 tab every morning until end of July then increase dose to 1 tablet every  Morning.  Irregular ventricular heartbeat - PVCs.   Plan: TSH, EKG 12-Lead. Refer to Cardiology for further evaluation.  Unspecified constipation - Pt needs CRS; will discuss w/ family and try to convince pt to have this screening performed. Pt does agree to have consultation.   Plan: Refer to GI.  Unspecified hypothyroidism- Continue current dose of medication pending lab result.  Loss of weight - Plan: IFOBT POC (occult bld, rslt in office)- this was postive.  TSH, EKG 12-Lead, T3, Free  Urinary frequency - Plan: POCT urinalysis dipstick  Medication refills- pt has refills on all medications.

## 2013-02-16 NOTE — Patient Instructions (Addendum)
Medication for anxiety- Sertraline 50 mg tablet- Take 1/2 tablet every morning until the end of July . On August 1st, start taking 1 whole tablet every morning.

## 2013-02-17 LAB — BASIC METABOLIC PANEL
BUN: 16 mg/dL (ref 6–23)
Calcium: 9.6 mg/dL (ref 8.4–10.5)
Creat: 1.06 mg/dL (ref 0.50–1.10)
Glucose, Bld: 104 mg/dL — ABNORMAL HIGH (ref 70–99)

## 2013-02-18 ENCOUNTER — Other Ambulatory Visit: Payer: Self-pay | Admitting: Family Medicine

## 2013-02-18 ENCOUNTER — Encounter: Payer: Self-pay | Admitting: Family Medicine

## 2013-02-18 MED ORDER — POTASSIUM BICARB-CITRIC ACID 10 MEQ PO TBEF: EFFERVESCENT_TABLET | ORAL | Status: DC

## 2013-02-18 NOTE — Progress Notes (Signed)
Quick Note:  Please contact pt and advise that the following labs are abnormal... Potassium to below normal. I have prescribed a medication to help increase potassium back to normal. This medication is at your pharmacy for pick up.  Reduce Furosemide to once a day (currently you are taking this medication twice a day).  You need to return to clinic in 1 week to have potassium rechecked.  Thyroid results are normal; continue the same thyroid medication.  Copy to pt. ______

## 2013-02-20 ENCOUNTER — Telehealth: Payer: Self-pay

## 2013-02-20 ENCOUNTER — Other Ambulatory Visit: Payer: Self-pay | Admitting: Family Medicine

## 2013-02-20 MED ORDER — POTASSIUM CHLORIDE ER 8 MEQ PO TBCR: EXTENDED_RELEASE_TABLET | ORAL | Status: DC

## 2013-02-20 NOTE — Telephone Encounter (Signed)
PT STATES THE MEDICINE SHE WAS GIVEN COST OVER $40.00 AND SHE NEED TO HAVE SOMETHING ELSE CALLED IN. PLEASE CALL [phone]

## 2013-02-20 NOTE — Telephone Encounter (Signed)
Pt aware of change and will pick up medication this afternoon.

## 2013-02-20 NOTE — Telephone Encounter (Signed)
Called her she states the potassium is expensive. $54. Can we change?

## 2013-02-20 NOTE — Telephone Encounter (Signed)
I changed the medication to potassium chloride 8 mEq ; pt should take 2 tabs every morning and 1 tab in the evening.

## 2013-02-22 ENCOUNTER — Telehealth: Payer: Self-pay

## 2013-02-22 MED ORDER — CLONAZEPAM 0.5 MG PO TABS: ORAL_TABLET | ORAL | Status: DC

## 2013-02-22 NOTE — Telephone Encounter (Signed)
I will phone in a refill for Clonazepam; it sounds like she may need to continue taking it as needed for anxiety and agitation. If Zoloft is effective, she will feel less agitation as time goes on. I expect her to fell better by middle to end of August. [facility]

## 2013-02-22 NOTE — Telephone Encounter (Signed)
Pt called and needs clarification of how long  Dr Audria Nine wants her to continue the clonazepam. She has been taking 1/2 Zoloft as instr'd and plans to increase this to 1 tab QD on 03/09/13. Pt is currently still taking clonazepam 0.5 mg 1 tab BID. Is she supposed to stop this on Aug 1st? If so, pt would [facility] for only the amount she needs until then. She will run out of her current bottle by Monday. [facility] but doesn't want to buy any more than she needs if she is to DC it. Dr Audria Nine, please advise. Pt seems very confused and as she put it, "agitated".  Pt has someone that can p/up her Rx tomorrow if needed so would like a call back today if possible.

## 2013-02-22 NOTE — Telephone Encounter (Signed)
Pt is needing Britta Mccreedy to call her back about her prescriptions she is confused Call back number [phone]

## 2013-02-23 NOTE — Telephone Encounter (Signed)
D/W pt at length how to increase her Zoloft to 1 QD and how to try tapering off the clonazepam. Pt asked for me to repeat several times and did repeat advice back several times. Advised pt to CB if she has more questions when she does this in Aug. Pt agreed.

## 2013-02-26 ENCOUNTER — Telehealth: Payer: Self-pay

## 2013-02-26 NOTE — Telephone Encounter (Signed)
Patient is calling to get a nurse to call her and let her know when is the latest date she can get her potassium checked please call her at [phone]

## 2013-02-28 NOTE — Telephone Encounter (Signed)
Advised pt to just come in when she can.  She stated she has some personal things going on.

## 2013-03-18 ENCOUNTER — Encounter: Payer: Self-pay | Admitting: Family Medicine

## 2013-03-18 ENCOUNTER — Ambulatory Visit (INDEPENDENT_AMBULATORY_CARE_PROVIDER_SITE_OTHER): Payer: Medicare Other | Admitting: Family Medicine

## 2013-03-18 VITALS — BP 180/110 | HR 79 | Temp 97.8°F | Resp 16

## 2013-03-18 DIAGNOSIS — I10 Essential (primary) hypertension: Secondary | ICD-10-CM

## 2013-03-18 DIAGNOSIS — I739 Peripheral vascular disease, unspecified: Secondary | ICD-10-CM

## 2013-03-18 DIAGNOSIS — E876 Hypokalemia: Secondary | ICD-10-CM

## 2013-03-18 DIAGNOSIS — F32A Depression, unspecified: Secondary | ICD-10-CM

## 2013-03-18 DIAGNOSIS — F329 Major depressive disorder, single episode, unspecified: Secondary | ICD-10-CM

## 2013-03-18 MED ORDER — AMLODIPINE BESYLATE 5 MG PO TABS: 5.0000 mg | ORAL_TABLET | Freq: Every day | ORAL | Status: DC

## 2013-03-18 MED ORDER — VENLAFAXINE HCL ER 150 MG PO CP24: 150.0000 mg | ORAL_CAPSULE | Freq: Every day | ORAL | Status: DC

## 2013-03-18 NOTE — Patient Instructions (Addendum)
We are switching from zoloft to Effexor.  Also we are checking the potassium and adding a blood pressure medicine.  Also, you need to see a cardiologist and eventually should have a colonoscopy.

## 2013-03-18 NOTE — Progress Notes (Signed)
67 year old woman whose brought in by her family members because of failure to improve on Zoloft. She continues to feel like the end is near has pretty much given up on life. Foot pain continues bilaterally without edema.  Patient has had an positive hematest but refuses colonoscopy.  Objective: Patient stares at me for long periods of time, family members fill in the details of a woman who doesn't get dressed during the day, complains about not wanting to live etc.  Skin: Pasty white Patient is alert and cooperative. Chest: Clear Heart: Regular no murmur Extremities: Hammertoes, no edema, absent pedal pulses, cool toes which are violaceous in color  Assessment: Depressed woman who refuses psychiatric care and refused gastroenterology workup. Zoloft discussing the working. She may still be hypokalemic so we'll check that.  Plan:Depression - Plan: venlafaxine XR (EFFEXOR XR) 150 MG 24 hr capsule  Hypokalemia - Plan: Comprehensive metabolic panel  Peripheral artery disease - Plan: Ambulatory referral to Cardiology, amLODipine (NORVASC) 5 MG tablet  Hypertension - Plan: amLODipine (NORVASC) 5 MG tablet  Signed, Elvina Sidle, MD

## 2013-03-19 ENCOUNTER — Telehealth: Payer: Self-pay

## 2013-03-19 LAB — COMPREHENSIVE METABOLIC PANEL
ALT: 13 U/L (ref 0–35)
AST: 20 U/L (ref 0–37)
Albumin: 4.3 g/dL (ref 3.5–5.2)
Alkaline Phosphatase: 67 U/L (ref 39–117)
BUN: 11 mg/dL (ref 6–23)
CO2: 27 mEq/L (ref 19–32)
Calcium: 9.5 mg/dL (ref 8.4–10.5)
Chloride: 94 mEq/L — ABNORMAL LOW (ref 96–112)
Creat: 0.91 mg/dL (ref 0.50–1.10)
Glucose, Bld: 86 mg/dL (ref 70–99)
Potassium: 3.1 mEq/L — ABNORMAL LOW (ref 3.5–5.3)
Sodium: 131 mEq/L — ABNORMAL LOW (ref 135–145)
Total Bilirubin: 0.6 mg/dL (ref 0.3–1.2)
Total Protein: 6.9 g/dL (ref 6.0–8.3)

## 2013-03-19 NOTE — Telephone Encounter (Signed)
Patient wants to know if she can continue taking chlorthalidone with amlodipine. [phone]

## 2013-03-20 ENCOUNTER — Other Ambulatory Visit: Payer: Self-pay | Admitting: Family Medicine

## 2013-03-20 ENCOUNTER — Telehealth: Payer: Self-pay

## 2013-03-20 MED ORDER — POTASSIUM CHLORIDE CRYS ER 20 MEQ PO TBCR: 20.0000 meq | EXTENDED_RELEASE_TABLET | Freq: Every day | ORAL | Status: DC

## 2013-03-20 NOTE — Telephone Encounter (Signed)
Thanks I have called her to advise no answer.

## 2013-03-20 NOTE — Telephone Encounter (Signed)
PT WOULD LIKE TO KNOW IF HER LAB RESULTS HAVE COME BACK PLEASE CALL [phone]

## 2013-03-20 NOTE — Telephone Encounter (Signed)
Stop the chlorthalidone, start potassium replacement, continue amlodipine

## 2013-03-20 NOTE — Telephone Encounter (Signed)
Called patient. She is advised. Again, lengthy phone conversation.

## 2013-03-20 NOTE — Telephone Encounter (Signed)
She is upset. She is advised to continue the Klor Con.

## 2013-03-20 NOTE — Telephone Encounter (Signed)
Patient has abnormal lab values. Potassium is very low. Stop chlorthalidone and take potassium 20 mEq daily. Recheck 2 weeks Called patient to advise.

## 2013-03-21 ENCOUNTER — Telehealth: Payer: Self-pay

## 2013-03-21 NOTE — Telephone Encounter (Signed)
Pts niece calling to say that Christy Luna was told to take both bp meds and then someone called and said stop taking one of them,which is correct???   Best phone [phone]

## 2013-03-22 NOTE — Telephone Encounter (Signed)
She was told to d/c the Clorthalidone and use the Amlodipine only I called her niece to advise.

## 2013-03-27 ENCOUNTER — Ambulatory Visit (INDEPENDENT_AMBULATORY_CARE_PROVIDER_SITE_OTHER): Payer: Medicare Other | Admitting: Family Medicine

## 2013-03-27 VITALS — BP 164/80 | HR 81 | Temp 97.8°F | Resp 18

## 2013-03-27 DIAGNOSIS — F32A Depression, unspecified: Secondary | ICD-10-CM

## 2013-03-27 DIAGNOSIS — F3289 Other specified depressive episodes: Secondary | ICD-10-CM

## 2013-03-27 DIAGNOSIS — R609 Edema, unspecified: Secondary | ICD-10-CM

## 2013-03-27 DIAGNOSIS — R109 Unspecified abdominal pain: Secondary | ICD-10-CM

## 2013-03-27 DIAGNOSIS — R309 Painful micturition, unspecified: Secondary | ICD-10-CM

## 2013-03-27 DIAGNOSIS — E876 Hypokalemia: Secondary | ICD-10-CM

## 2013-03-27 DIAGNOSIS — F329 Major depressive disorder, single episode, unspecified: Secondary | ICD-10-CM

## 2013-03-27 DIAGNOSIS — I10 Essential (primary) hypertension: Secondary | ICD-10-CM

## 2013-03-27 LAB — POCT URINALYSIS DIPSTICK
Ketones, UA: NEGATIVE
Protein, UA: NEGATIVE
Spec Grav, UA: <=1.005
pH, UA: 5.5

## 2013-03-27 LAB — POCT CBC
Granulocyte percent: 72.9 %G (ref 37–80)
MCH, POC: 27.5 pg (ref 27–31.2)
MID (cbc): 0.5 (ref 0–0.9)
MPV: 10.2 fL (ref 0–99.8)
POC MID %: 6.8 %M (ref 0–12)
Platelet Count, POC: 250 10*3/uL (ref 142–424)
RBC: 4.61 M/uL (ref 4.04–5.48)
RDW, POC: 16 %
WBC: 7.8 10*3/uL (ref 4.6–10.2)

## 2013-03-27 LAB — POCT UA - MICROSCOPIC ONLY
Crystals, Ur, HPF, POC: NEGATIVE
Epithelial cells, urine per micros: NEGATIVE

## 2013-03-27 MED ORDER — LISINOPRIL 20 MG PO TABS: 20.0000 mg | ORAL_TABLET | Freq: Every day | ORAL | Status: DC

## 2013-03-27 MED ORDER — FOSFOMYCIN TROMETHAMINE 3 G PO PACK: 3.0000 g | PACK | Freq: Once | ORAL | Status: DC

## 2013-03-27 NOTE — Progress Notes (Addendum)
[facility] [address] [place] [address] [phone]  Date:  03/27/2013   Name:  Christy Luna   DOB:  08-Jan-1946   MRN:  [id number]  PCP:  Dois Davenport., MD    Chief Complaint: Medication Problem and Urinary Urgency   History of Present Illness:  Christy Luna is a 67 y.o. very pleasant female patient who presents with the following:  She was last seen here on the 10th of this month with severe depression.  She was taken off zoloft and started on effexor.  She was also advised regarding her potassium- it was low, she was asked to stop chlorthalidone and start potassium supplementation.  Also her lisinopril was changed to amlodipine.      She is here today due to "not going to the bathroom like I should to urinate."  She feels that her urination is not normal, but otherwise cannot describe what is wrong.  She does not note urinary urgency, does not feel like she has urinary retentions.    She is here with her sister in law today.   She has had problems with the circulation in her legs.  Appt with cardiolgoy 04/10/13.  She notes that her LE edema has become significantly worse since she started the amlodipine.  She has bilateral leg swelling She needs a potassium recheck today- K was low at her last visit.      TSH checked last month  She is single, no children. She did work in the past, but not any more. Her SIL reports that 3 or 4 years ago she was doing much better, driving and getting out.  She has become increasingly depressed and withdrawn, and now does not leave her home or even get dressed most days.  She is currently wearing her pajamas.  She denies any SI or HI.  So far she has refused to see a psychiatrist.   Sates she is still taking lasix, 20 mg a day.    Leota [phone]; her sister in low who is very involved in her care.    Patient Active Problem List   Diagnosis Date Noted  . Generalized anxiety disorder 02/16/2013  . Unspecified  hypothyroidism 02/16/2013  . Raynaud's disease 02/16/2013  . Lower extremity venous stasis 02/16/2013    Past Medical History  Diagnosis Date  . Arthritis   . Thyroid disease   . Anxiety   . Hypertension     No past surgical history on file.  History  Substance Use Topics  . Smoking status: Never Smoker   . Smokeless tobacco: Not on file  . Alcohol Use: No    Family History  Problem Relation Age of Onset  . Heart disease Father   . Heart disease Brother   . Arthritis Brother   . Tuberculosis Maternal Grandmother   . Heart disease Maternal Grandfather   . Heart disease Paternal Grandmother   . Cancer Paternal Grandfather     Allergies  Allergen Reactions  . Augmentin [Amoxicillin-Pot Clavulanate]     Is able to take in small doses.  [facility]     Medication list has been reviewed and updated.  Current Outpatient Prescriptions on File Prior to Visit  Medication Sig Dispense Refill  . amLODipine (NORVASC) 5 MG tablet Take 1 tablet (5 mg total) by mouth daily.  90 tablet  3  . clobetasol cream (TEMOVATE) 0.05 % Apply topically daily. Do no use for more  than 2 consecutive weeks  30 g  0  . clonazePAM (KLONOPIN) 0.5 MG tablet TAKE 1 TABLET TWICE A DAY AS NEEDED  60 tablet  0  . furosemide (LASIX) 20 MG tablet TAKE 1 TABLET TWICE A DAY AS NEEDED  60 tablet  3  . levothyroxine (SYNTHROID, LEVOTHROID) 25 MCG tablet Take 1 tablet (25 mcg total) by mouth daily.  90 tablet  3  . potassium chloride (KLOR-CON) 8 MEQ tablet Take 2 tablets by mouth every morning and 1 tablet in the evening.  100 tablet  0  . potassium chloride SA (K-DUR,KLOR-CON) 20 MEQ tablet Take 1 tablet (20 mEq total) by mouth daily.  30 tablet  3  . venlafaxine XR (EFFEXOR XR) 150 MG 24 hr capsule Take 1 capsule (150 mg total) by mouth daily.  30 capsule  1   No current facility-administered medications on file prior to visit.    Review of Systems:  As per HPI- otherwise  negative.   Physical Examination: Filed Vitals:   03/27/13 1332  BP: 164/80  Pulse: 81  Temp: 97.8 F (36.6 C)  Resp: 18   There were no vitals filed for this visit. There is no weight on file to calculate BMI. Ideal Body Weight:    GEN: WDWN, NAD, Non-toxic, Alert, does not say much but is oriented.  Wearing her pajamas HEENT: Atraumatic, Normocephalic. Neck supple. No masses, No LAD. Ears and Nose: No external deformity. CV: RRR, No M/G/R. No JVD. No thrill. No extra heart sounds. PULM: CTA B, no wheezes, crackles, rhonchi. No retractions. No resp. distress. No accessory muscle use. ABD: S, NT, ND, +BS. No rebound. No HSM.  No CVA tenderness.  Unable to palpate any enlarged bladder EXTR: No c/c/e NEURO uses a walker PSYCH: Normally interactive. Conversant. Not depressed or anxious appearing.  Calm demeanor.   Results for orders placed in visit on 03/27/13  POCT UA - MICROSCOPIC ONLY      Result Value Range   WBC, Ur, HPF, POC 3-9     RBC, urine, microscopic 1-4     Bacteria, U Microscopic 1+     Mucus, UA pos     Epithelial cells, urine per micros neg     Crystals, Ur, HPF, POC neg     Casts, Ur, LPF, POC neg     Yeast, UA neg    POCT URINALYSIS DIPSTICK      Result Value Range   Color, UA yellow     Clarity, UA clear     Glucose, UA neg     Bilirubin, UA neg     Ketones, UA neg     Spec Grav, UA <=1.005     Blood, UA neg     pH, UA 5.5     Protein, UA neg     Urobilinogen, UA 0.2     Nitrite, UA neg     Leukocytes, UA small (1+)    POCT CBC      Result Value Range   WBC 7.8  4.6 - 10.2 K/uL   Lymph, poc 1.6  0.6 - 3.4   POC LYMPH PERCENT 20.3  10 - 50 %L   MID (cbc) 0.5  0 - 0.9   POC MID % 6.8  0 - 12 %M   POC Granulocyte 5.7  2 - 6.9   Granulocyte percent 72.9  37 - 80 %G   RBC 4.61  4.04 - 5.48 M/uL   Hemoglobin 12.7  12.2 -  16.2 g/dL   HCT, POC 19.1  47.8 - 47.9 %   MCV 85.8  80 - 97 fL   MCH, POC 27.5  27 - 31.2 pg   MCHC 32.1  31.8 - 35.4 g/dL    RDW, POC 29.5     Platelet Count, POC 250  142 - 424 K/uL   MPV 10.2  0 - 99.8 fL    Assessment and Plan: Depression  Urinary pain - Plan: POCT UA - Microscopic Only, POCT urinalysis dipstick, Urine culture, fosfomycin (MONUROL) 3 G PACK  Hypokalemia - Plan: Comprehensive metabolic panel  Essential hypertension, benign - Plan: Comprehensive metabolic panel, lisinopril (PRINIVIL,ZESTRIL) 20 MG tablet  Peripheral edema - Plan: Comprehensive metabolic panel  Abdominal  pain, other specified site - Plan: POCT CBC  Depression- discussed in detail with pt and her SIL.  At this time she does not desire any further treatment. Discussed having an evaluation at [facility].  She refused and is not committable in my opinion.  Her sister in law agrees that she does not seem to be a danger to herself.   Await recheck CMP HTN: swelling worse with amlodipine. D/c this and go back on lisinopril, which she has at home Likely UTI; relative contraindications to most abx. We will treat her with fosfomycin   Plan close follow-up pending her labs.  Let me know if worse or not getting better!    Signed Abbe Amsterdam, MD

## 2013-03-27 NOTE — Patient Instructions (Addendum)
We are going to treat you for a UTI with fosfomycin- this is a one time medication (you just take one dose).  I will be in touch with your labs as soon as they come in.  Change back from amlodipine to lisinopril for your high blood pressure.  Continue the same dose for now.  I [facility] [address].    I sent the fosfomycin rx to the [facility] on the corner of [address] and [address]

## 2013-03-28 LAB — COMPREHENSIVE METABOLIC PANEL
ALT: 14 U/L (ref 0–35)
AST: 20 U/L (ref 0–37)
Alkaline Phosphatase: 75 U/L (ref 39–117)
Calcium: 9.4 mg/dL (ref 8.4–10.5)
Chloride: 95 mEq/L — ABNORMAL LOW (ref 96–112)
Creat: 0.82 mg/dL (ref 0.50–1.10)

## 2013-03-28 LAB — URINE CULTURE
Colony Count: NO GROWTH
Organism ID, Bacteria: NO GROWTH

## 2013-03-30 ENCOUNTER — Emergency Department (HOSPITAL_BASED_OUTPATIENT_CLINIC_OR_DEPARTMENT_OTHER)
Admission: EM | Admit: 2013-03-30 | Discharge: 2013-03-30 | Disposition: A | Discharge status: Home / Self Care | Payer: Medicare Other | Attending: Emergency Medicine | Admitting: Emergency Medicine

## 2013-03-30 ENCOUNTER — Telehealth: Payer: Self-pay

## 2013-03-30 ENCOUNTER — Encounter (HOSPITAL_BASED_OUTPATIENT_CLINIC_OR_DEPARTMENT_OTHER): Payer: Self-pay

## 2013-03-30 ENCOUNTER — Emergency Department (HOSPITAL_BASED_OUTPATIENT_CLINIC_OR_DEPARTMENT_OTHER): Payer: Medicare Other

## 2013-03-30 DIAGNOSIS — I509 Heart failure, unspecified: Secondary | ICD-10-CM | POA: Insufficient documentation

## 2013-03-30 DIAGNOSIS — R51 Headache: Secondary | ICD-10-CM | POA: Insufficient documentation

## 2013-03-30 DIAGNOSIS — Z8739 Personal history of other diseases of the musculoskeletal system and connective tissue: Secondary | ICD-10-CM | POA: Insufficient documentation

## 2013-03-30 DIAGNOSIS — F3289 Other specified depressive episodes: Secondary | ICD-10-CM | POA: Insufficient documentation

## 2013-03-30 DIAGNOSIS — F329 Major depressive disorder, single episode, unspecified: Secondary | ICD-10-CM | POA: Insufficient documentation

## 2013-03-30 DIAGNOSIS — R109 Unspecified abdominal pain: Secondary | ICD-10-CM | POA: Insufficient documentation

## 2013-03-30 DIAGNOSIS — I10 Essential (primary) hypertension: Secondary | ICD-10-CM | POA: Insufficient documentation

## 2013-03-30 DIAGNOSIS — F411 Generalized anxiety disorder: Secondary | ICD-10-CM | POA: Insufficient documentation

## 2013-03-30 DIAGNOSIS — E079 Disorder of thyroid, unspecified: Secondary | ICD-10-CM | POA: Insufficient documentation

## 2013-03-30 DIAGNOSIS — Z79899 Other long term (current) drug therapy: Secondary | ICD-10-CM | POA: Insufficient documentation

## 2013-03-30 DIAGNOSIS — R0602 Shortness of breath: Secondary | ICD-10-CM | POA: Insufficient documentation

## 2013-03-30 HISTORY — DX: Depression, unspecified: F32.A

## 2013-03-30 HISTORY — DX: Major depressive disorder, single episode, unspecified: F32.9

## 2013-03-30 LAB — CBC WITH DIFFERENTIAL/PLATELET
Lymphocytes Relative: 15 % (ref 12–46)
Lymphs Abs: 1.1 10*3/uL (ref 0.7–4.0)
MCV: 83.1 fL (ref 78.0–100.0)
Neutro Abs: 5.6 10*3/uL (ref 1.7–7.7)
Neutrophils Relative %: 71 % (ref 43–77)
Platelets: 228 10*3/uL (ref 150–400)
RBC: 4.32 MIL/uL (ref 3.87–5.11)
WBC: 7.8 10*3/uL (ref 4.0–10.5)

## 2013-03-30 LAB — URINALYSIS, ROUTINE W REFLEX MICROSCOPIC
Bilirubin Urine: NEGATIVE
Leukocytes, UA: NEGATIVE
Nitrite: NEGATIVE
Specific Gravity, Urine: 1.005 (ref 1.005–1.030)
pH: 6 (ref 5.0–8.0)

## 2013-03-30 LAB — PRO B NATRIURETIC PEPTIDE: Pro B Natriuretic peptide (BNP): 249 pg/mL — ABNORMAL HIGH (ref 0–125)

## 2013-03-30 LAB — COMPREHENSIVE METABOLIC PANEL
AST: 20 U/L (ref 0–37)
Albumin: 3.7 g/dL (ref 3.5–5.2)
BUN: 20 mg/dL (ref 6–23)
Calcium: 9.4 mg/dL (ref 8.4–10.5)
Chloride: 99 mEq/L (ref 96–112)
Creatinine, Ser: 1 mg/dL (ref 0.50–1.10)
Total Bilirubin: 0.2 mg/dL — ABNORMAL LOW (ref 0.3–1.2)
Total Protein: 6.9 g/dL (ref 6.0–8.3)

## 2013-03-30 NOTE — Telephone Encounter (Signed)
Bonita Quin  Is Shenica Shafer's sister in law, needing lab results asap. Greysen's family is taking her to the fast track at [facility] long because she is not doing well at all. Clinical TL aware and spoke to them.

## 2013-03-30 NOTE — ED Notes (Signed)
Went over discharge instructions with Pt. After helping her dress.  Pt. Sister in law and brother to care for her and get her home.  Pt. In no distress at time of discharge.  Expressed to family and Pt. The need for social work to see Pt. Thru her PMD

## 2013-03-30 NOTE — ED Provider Notes (Addendum)
CSN: [id number]     Arrival date & time 03/30/13  1113 History     First MD Initiated Contact with Patient 03/30/13 1208     Chief Complaint  Patient presents with  . Leg Swelling   (Consider location/radiation/quality/duration/timing/severity/associated sxs/prior Treatment) HPI Complains of bilateral leg swelling for several weeks, worse in the past 7-10 days. Also complains of mild shortness of breath and vague abdominal discomfort.. Past Medical History  Diagnosis Date  . Arthritis   . Thyroid disease   . Anxiety   . Hypertension   . Depression    seen urgent care last week prescribed fosfomycin for UTI. States that legs continue to swell. Denies chest pain denies fever denies other complaint. History reviewed. No pertinent past surgical history. Family History  Problem Relation Age of Onset  . Heart disease Father   . Heart disease Brother   . Arthritis Brother   . Tuberculosis Maternal Grandmother   . Heart disease Maternal Grandfather   . Heart disease Paternal Grandmother   . Cancer Paternal Grandfather    History  Substance Use Topics  . Smoking status: Never Smoker   . Smokeless tobacco: Not on file  . Alcohol Use: No   OB History   Grav Para Term Preterm Abortions TAB SAB Ect Mult Living                 Review of Systems  Constitutional: Negative.   Respiratory: Positive for shortness of breath.   Cardiovascular: Positive for leg swelling.  Gastrointestinal: Positive for abdominal pain.  Musculoskeletal: Negative.   Skin: Negative.   Neurological: Positive for headaches.  Psychiatric/Behavioral: Negative.   All other systems reviewed and are negative.    Allergies  Augmentin and Sulfa drugs cross reactors  Home Medications   Current Outpatient Rx  Name  Route  Sig  Dispense  Refill  . amLODipine (NORVASC) 5 MG tablet   Oral   Take 1 tablet (5 mg total) by mouth daily.   90 tablet   3   . clobetasol cream (TEMOVATE) 0.05 %   Topical  Apply topically daily. Do no use for more than 2 consecutive weeks   30 g   0   . clonazePAM (KLONOPIN) 0.5 MG tablet      TAKE 1 TABLET TWICE A DAY AS NEEDED   60 tablet   0   . fosfomycin (MONUROL) 3 G PACK   Oral   Take 3 g by mouth once.   1 g   0   . furosemide (LASIX) 20 MG tablet      TAKE 1 TABLET TWICE A DAY AS NEEDED   60 tablet   3   . levothyroxine (SYNTHROID, LEVOTHROID) 25 MCG tablet   Oral   Take 1 tablet (25 mcg total) by mouth daily.   90 tablet   3   . lisinopril (PRINIVIL,ZESTRIL) 20 MG tablet   Oral   Take 1 tablet (20 mg total) by mouth daily.   90 tablet   3   . potassium chloride (KLOR-CON) 8 MEQ tablet      Take 2 tablets by mouth every morning and 1 tablet in the evening.   100 tablet   0   . potassium chloride SA (K-DUR,KLOR-CON) 20 MEQ tablet   Oral   Take 1 tablet (20 mEq total) by mouth daily.   30 tablet   3   . venlafaxine XR (EFFEXOR XR) 150 MG 24 hr capsule  Oral   Take 1 capsule (150 mg total) by mouth daily.   30 capsule   1    BP 144/78  Pulse 96  Temp(Src) 97.7 F (36.5 C) (Oral)  Resp 20  Ht 5\' 4"  (1.626 m)  Wt 175 lb (79.379 kg)  BMI 30.02 kg/m2  SpO2 98% Physical Exam  Nursing note and vitals reviewed. Constitutional: She appears well-developed and well-nourished.  HENT:  Head: Normocephalic and atraumatic.  Eyes: Conjunctivae are normal. Pupils are equal, round, and reactive to light.  Neck: Neck supple. No tracheal deviation present. No thyromegaly present.  Cardiovascular: Normal rate and regular rhythm.   No murmur heard. Pulmonary/Chest: Effort normal.  Scant rales at bases. No respiratory distress speaks in paragraphs  Abdominal: Soft. Bowel sounds are normal. She exhibits no distension. There is no tenderness.  Obese  Musculoskeletal: Normal range of motion. She exhibits edema. She exhibits no tenderness.  2+ pretibial pitting edema bilaterally with minimal redness bilateral shins and legs.  Nontender. DP pulses 2+ bilaterally  Neurological: She is alert. Coordination normal.  Skin: Skin is warm and dry. No rash noted.  Psychiatric: She has a normal mood and affect.    ED Course   Procedures (including critical care time)  Labs Reviewed  CBC WITH DIFFERENTIAL  COMPREHENSIVE METABOLIC PANEL   No results found. No diagnosis found.  Date: 03/30/2013  Rate: 80  Rhythm: normal sinus rhythm  QRS Axis: normal  Intervals: normal  ST/T Wave abnormalities: nonspecific T wave changes  Conduction Disutrbances:none  Narrative Interpretation:   Old EKG Reviewed: none available   Results for orders placed during the hospital encounter of 03/30/13  COMPREHENSIVE METABOLIC PANEL      Result Value Range   Sodium 134 (*) 135 - 145 mEq/L   Potassium 4.3  3.5 - 5.1 mEq/L   Chloride 99  96 - 112 mEq/L   CO2 24  19 - 32 mEq/L   Glucose, Bld 96  70 - 99 mg/dL   BUN 20  6 - 23 mg/dL   Creatinine, Ser 5.78  0.50 - 1.10 mg/dL   Calcium 9.4  8.4 - 46.9 mg/dL   Total Protein 6.9  6.0 - 8.3 g/dL   Albumin 3.7  3.5 - 5.2 g/dL   AST 20  0 - 37 U/L   ALT 14  0 - 35 U/L   Alkaline Phosphatase 75  39 - 117 U/L   Total Bilirubin 0.2 (*) 0.3 - 1.2 mg/dL   GFR calc non Af Amer 58 (*) >90 mL/min   GFR calc Af Amer 67 (*) >90 mL/min  PRO B NATRIURETIC PEPTIDE      Result Value Range   Pro B Natriuretic peptide (BNP) 249.0 (*) 0 - 125 pg/mL  URINALYSIS, ROUTINE W REFLEX MICROSCOPIC      Result Value Range   Color, Urine YELLOW  YELLOW   APPearance CLEAR  CLEAR   Specific Gravity, Urine 1.005  1.005 - 1.030   pH 6.0  5.0 - 8.0   Glucose, UA NEGATIVE  NEGATIVE mg/dL   Hgb urine dipstick NEGATIVE  NEGATIVE   Bilirubin Urine NEGATIVE  NEGATIVE   Ketones, ur NEGATIVE  NEGATIVE mg/dL   Protein, ur NEGATIVE  NEGATIVE mg/dL   Urobilinogen, UA 0.2  0.0 - 1.0 mg/dL   Nitrite NEGATIVE  NEGATIVE   Leukocytes, UA NEGATIVE  NEGATIVE  TROPONIN I      Result Value Range   Troponin I <0.30  <0.30  ng/mL  CBC WITH DIFFERENTIAL      Result Value Range   WBC 7.8  4.0 - 10.5 K/uL   RBC 4.32  3.87 - 5.11 MIL/uL   Hemoglobin 12.1  12.0 - 15.0 g/dL   HCT 16.1 (*) 09.6 - 04.5 %   MCV 83.1  78.0 - 100.0 fL   MCH 28.0  26.0 - 34.0 pg   MCHC 33.7  30.0 - 36.0 g/dL   RDW 40.9  81.1 - 91.4 %   Platelets 228  150 - 400 K/uL   Neutrophils Relative % 71  43 - 77 %   Neutro Abs 5.6  1.7 - 7.7 K/uL   Lymphocytes Relative 15  12 - 46 %   Lymphs Abs 1.1  0.7 - 4.0 K/uL   Monocytes Relative 11  3 - 12 %   Monocytes Absolute 0.8  0.1 - 1.0 K/uL   Eosinophils Relative 2  0 - 5 %   Eosinophils Absolute 0.2  0.0 - 0.7 K/uL   Basophils Relative 1  0 - 1 %   Basophils Absolute 0.1  0.0 - 0.1 K/uL   Dg Chest 2 View  03/30/2013   *RADIOLOGY REPORT*  Clinical Data: Shortness of breath  CHEST - 2 VIEW  Comparison: None.  Findings: The heart and pulmonary vascularity are within normal limits.  The lungs are clear bilaterally. Nipple shadows are noted bilaterally. No bony abnormality is seen.  IMPRESSION: No acute abnormality is noted.   Original Report Authenticated By: Alcide Clever, M.D.   Chest xray viewed by me.   Hospitalization offered to  the patient which he declined. MDM  Patient is clinically and mild congestive heart failure. Spoke with Dr.Copeland from urgent medical. Plan increase Lasix. She is instructed to take 20 mg upon arrival home today, 20 mg at bedtime tonight then 20 mg twice daily. She will followup with Dr.Copeland in the office on 04/02/2013 Dx: congestive heart failure    Doug Sou, MD 03/30/13 1406  Doug Sou, MD 03/30/13 1601

## 2013-03-30 NOTE — Telephone Encounter (Signed)
Spoke with patient's sister (with patient present).  Patient is on her way to ER on [address] because legs are very swollen.  Trouble breathing.  Told her they are doing the right thing and that the ER will have access to her chart and lab results in [facility].   FYI Dr Patsy Lager

## 2013-03-30 NOTE — ED Notes (Signed)
C/o bilat leg swelling-worse this week with leaking fluid-pt was brought to tx area via w/c-states she is able to walk with walker-pt stood from w/c-pt and family concerned c/o r/t to changes in medication

## 2013-04-02 ENCOUNTER — Ambulatory Visit (INDEPENDENT_AMBULATORY_CARE_PROVIDER_SITE_OTHER): Payer: Medicare Other | Admitting: Family Medicine

## 2013-04-02 ENCOUNTER — Ambulatory Visit (HOSPITAL_COMMUNITY)
Admission: RE | Admit: 2013-04-02 | Discharge: 2013-04-02 | Disposition: A | Discharge status: Home / Self Care | Payer: Medicare Other | Source: Ambulatory Visit | Attending: Cardiology | Admitting: Cardiology

## 2013-04-02 VITALS — BP 142/76 | HR 84 | Temp 98.5°F | Resp 18 | Wt 181.6 lb

## 2013-04-02 DIAGNOSIS — R600 Localized edema: Secondary | ICD-10-CM

## 2013-04-02 DIAGNOSIS — R609 Edema, unspecified: Secondary | ICD-10-CM

## 2013-04-02 LAB — BASIC METABOLIC PANEL
CO2: 27 mEq/L (ref 19–32)
Glucose, Bld: 88 mg/dL (ref 70–99)
Potassium: 4.2 mEq/L (ref 3.5–5.3)
Sodium: 135 mEq/L (ref 135–145)

## 2013-04-02 NOTE — Patient Instructions (Addendum)
Please proceeds to [facility] for your echocardiogram.  Please arrive by 1:30 pm.  I will be in touch when I get this result in, most likely later today.    [facility]   [address]  [place], [place]  [address]  Phone:  [phone]

## 2013-04-02 NOTE — Progress Notes (Addendum)
[facility] [address] [place] [address] [phone]  Date:  04/02/2013   Name:  Christy Luna   DOB:  1945-08-12   MRN:  [id number]  PCP:  Dow Adolph, MD    Chief Complaint: Follow-up   History of Present Illness:  Christy Luna is a 67 y.o. very pleasant female patient who presents with the following:  Seen here on 8/19 with complaint of LE edema and depression. Seen at the ED on the 22nd and found to have an elevated BNP, thought to be in mild CHF.  Her lasix dose was increased to 20mg  BID.  Her Na was ok at the ED- it was low when I checked it on the 19th.   She will see Dr. Herbie Baltimore on 04/10/13 at [facility] as a new pt.  She is not sure if she has been Dx with CHF in the past.   She is here today as she was asked to follow-up by the ER physician.  However, she continues to feel generally poor, low energy and her legs hurt.  She does not have orthopnea.  She uses a walker at baseline.    TSH checked last month  Patient Active Problem List   Diagnosis Date Noted  . Generalized anxiety disorder 02/16/2013  . Unspecified hypothyroidism 02/16/2013  . Raynaud's disease 02/16/2013  . Lower extremity venous stasis 02/16/2013    Past Medical History  Diagnosis Date  . Arthritis   . Thyroid disease   . Anxiety   . Hypertension   . Depression     No past surgical history on file.  History  Substance Use Topics  . Smoking status: Never Smoker   . Smokeless tobacco: Not on file  . Alcohol Use: No    Family History  Problem Relation Age of Onset  . Heart disease Father   . Heart disease Brother   . Arthritis Brother   . Tuberculosis Maternal Grandmother   . Heart disease Maternal Grandfather   . Heart disease Paternal Grandmother   . Cancer Paternal Grandfather     Allergies  Allergen Reactions  . Augmentin [Amoxicillin-Pot Clavulanate]   . Sulfa Drugs Cross Reactors     Medication list has been reviewed and updated.  Current  Outpatient Prescriptions on File Prior to Visit  Medication Sig Dispense Refill  . clobetasol cream (TEMOVATE) 0.05 % Apply topically daily. Do no use for more than 2 consecutive weeks  30 g  0  . clonazePAM (KLONOPIN) 0.5 MG tablet TAKE 1 TABLET TWICE A DAY AS NEEDED  60 tablet  0  . furosemide (LASIX) 20 MG tablet TAKE 1 TABLET TWICE A DAY AS NEEDED  60 tablet  3  . levothyroxine (SYNTHROID, LEVOTHROID) 25 MCG tablet Take 1 tablet (25 mcg total) by mouth daily.  90 tablet  3  . lisinopril (PRINIVIL,ZESTRIL) 20 MG tablet Take 1 tablet (20 mg total) by mouth daily.  90 tablet  3  . potassium chloride SA (K-DUR,KLOR-CON) 20 MEQ tablet Take 1 tablet (20 mEq total) by mouth daily.  30 tablet  3  . venlafaxine XR (EFFEXOR XR) 150 MG 24 hr capsule Take 1 capsule (150 mg total) by mouth daily.  30 capsule  1  . amLODipine (NORVASC) 5 MG tablet Take 1 tablet (5 mg total) by mouth daily.  90 tablet  3  . fosfomycin (MONUROL) 3 G PACK Take 3 g by mouth once.  1 g  0  . potassium chloride (  KLOR-CON) 8 MEQ tablet Take 2 tablets by mouth every morning and 1 tablet in the evening.  100 tablet  0   No current facility-administered medications on file prior to visit.    Review of Systems:  As per HPI- otherwise negative. No acute change in her status- she feels about the same as she has  Physical Examination: Filed Vitals:   04/02/13 1039  BP: 142/76  Pulse: 84  Temp: 98.5 F (36.9 C)  Resp: 18   Filed Vitals:   04/02/13 1039  Weight: 181 lb 9.6 oz (82.373 kg)   Body mass index is 31.16 kg/(m^2). Ideal Body Weight:    GEN: WDWN, NAD, Non-toxic, A & O x 3, overweight, accompanied by her sister in law.  Today she is dressed in street clothes and is more conversant than at our last visit.  HEENT: Atraumatic, Normocephalic. Neck supple. No masses, No LAD. Ears and Nose: No external deformity. CV: RRR, No M/G/R. No JVD. No thrill. No extra heart sounds. PULM: CTA B, no wheezes, crackles,  rhonchi. No retractions. No resp. distress. No accessory muscle use. ABD: S, NT, ND, +BS. No rebound. No HSM. EXTR: No c/c.  She does have pitting edema to the mid- tibia bilaterally.  No weeping.  Her edematous areas are also tender.   NEURO Normal gait.  PSYCH: Normally interactive. Conversant. Not depressed or anxious appearing.  Calm demeanor.    Wt Readings from Last 3 Encounters:  04/02/13 181 lb 9.6 oz (82.373 kg)  03/30/13 175 lb (79.379 kg)  02/16/13 173 lb 12.8 oz (78.835 kg)   (admits that she was not actually weighted on the 22nd- 175 was her self- reported weight  Assessment and Plan: Leg edema - Plan: 2D Echocardiogram without contrast, Basic metabolic panel  Persistent LE edema.  CHF vs venous insufficiency (or both).  Ordered an echocardiogram today, await results.  Check BMP again.  If ok can increase her lasix further.  She has an appt with cardiology next week as well.  Will contact her tomorrow with echo as results not yet available at 9pm.  She is aware  Signed Abbe Amsterdam, MD  8/26- called [facility] to try and get echo report.  It is not read yet, will look again tomorrow.  Her labs are back, electrolytes and renal function ok.   Results for orders placed in visit on 04/02/13  BASIC METABOLIC PANEL      Result Value Range   Sodium 135  135 - 145 mEq/L   Potassium 4.2  3.5 - 5.3 mEq/L   Chloride 98  96 - 112 mEq/L   CO2 27  19 - 32 mEq/L   Glucose, Bld 88  70 - 99 mg/dL   BUN 20  6 - 23 mg/dL   Creat 1.61  0.96 - 0.45 mg/dL   Calcium 9.4  8.4 - 40.9 mg/dL   Called and discussed.  She can increase her lasix to 60 a day if she likes for her edema.  Will be in touch when I get her echo report

## 2013-04-02 NOTE — Progress Notes (Signed)
2D Echo Performed 04/02/2013    Kason Benak, RCS  

## 2013-04-05 ENCOUNTER — Encounter: Payer: Self-pay | Admitting: Family Medicine

## 2013-04-05 ENCOUNTER — Telehealth: Payer: Self-pay | Admitting: Family Medicine

## 2013-04-05 NOTE — Telephone Encounter (Signed)
Called pt to discuss her echo results.  Her echo is virtually normal:  Study Conclusions  - Left ventricle: The cavity size was normal. Systolic function was normal. The estimated ejection fraction was in the range of 60% to 65%. Wall motion was normal; there were no regional wall motion abnormalities. There was an increased relative contribution of atrial contraction to ventricular filling, which may be due to hypovolemia. Left ventricular diastolic function parameters were normal. - Left atrium: The atrium was mildly dilated. Transthoracic echocardiography. M-mode, complete 2D, spectral Doppler, and color Doppler. Height: Height: 162.6cm. Height: 64in. Weight: Weight: 82.6kg. Weight: 181.6lb. Body mass index: BMI: 31.2kg/m^2. Body surface area: BSA: 1.24m^2. Blood pressure: 149/81. Patient status: Outpatient. Location: Echo laboratory.  Christy Luna continues to have complaint of "feeling bad," feeling that she is "pressed up against the wall by my family, told to do this and do that." "My feet are like to kill me." Encouraged her to come in to clinic if she is not feeing well, and offered to call EMS to come out and check on her.  She instructed me NOT to call EMS, and stated that she was ok.  She will see cardiology next week.

## 2013-04-06 ENCOUNTER — Telehealth: Payer: Self-pay

## 2013-04-06 NOTE — Telephone Encounter (Signed)
Patient has itching back of neck, under arms, stomach,.  Don't know whats causing it.  Cannot take benadryl  Started today, scratching all over herself.   [phone]

## 2013-04-07 NOTE — Telephone Encounter (Signed)
ADVISED PT THAT SHE NEEDS TO BE SEEN. PT STATES THAT SHE FEELS LIKE SHE IS RUNNED DOWN AND SHE MAY NEED TO GO TO THE HOSPITAL.  I ASKED HER DID SHE HAVE SOMEONE TO TAKE HER TO THE HOSPITAL AND SHE STATED THAT SHE DID.

## 2013-04-10 ENCOUNTER — Ambulatory Visit: Payer: Self-pay | Admitting: Cardiology

## 2013-04-19 ENCOUNTER — Ambulatory Visit: Payer: BC Managed Care – PPO | Admitting: Family Medicine

## 2013-04-24 ENCOUNTER — Emergency Department (HOSPITAL_BASED_OUTPATIENT_CLINIC_OR_DEPARTMENT_OTHER)
Admission: EM | Admit: 2013-04-24 | Discharge: 2013-04-24 | Disposition: A | Discharge status: Home / Self Care | Payer: Medicare Other | Attending: Emergency Medicine | Admitting: Emergency Medicine

## 2013-04-24 ENCOUNTER — Encounter (HOSPITAL_BASED_OUTPATIENT_CLINIC_OR_DEPARTMENT_OTHER): Payer: Self-pay | Admitting: *Deleted

## 2013-04-24 DIAGNOSIS — F329 Major depressive disorder, single episode, unspecified: Secondary | ICD-10-CM | POA: Insufficient documentation

## 2013-04-24 DIAGNOSIS — I10 Essential (primary) hypertension: Secondary | ICD-10-CM | POA: Insufficient documentation

## 2013-04-24 DIAGNOSIS — R1033 Periumbilical pain: Secondary | ICD-10-CM | POA: Insufficient documentation

## 2013-04-24 DIAGNOSIS — L299 Pruritus, unspecified: Secondary | ICD-10-CM | POA: Insufficient documentation

## 2013-04-24 DIAGNOSIS — E079 Disorder of thyroid, unspecified: Secondary | ICD-10-CM | POA: Insufficient documentation

## 2013-04-24 DIAGNOSIS — Z79899 Other long term (current) drug therapy: Secondary | ICD-10-CM | POA: Insufficient documentation

## 2013-04-24 DIAGNOSIS — R109 Unspecified abdominal pain: Secondary | ICD-10-CM

## 2013-04-24 DIAGNOSIS — L259 Unspecified contact dermatitis, unspecified cause: Secondary | ICD-10-CM | POA: Insufficient documentation

## 2013-04-24 DIAGNOSIS — N898 Other specified noninflammatory disorders of vagina: Secondary | ICD-10-CM | POA: Insufficient documentation

## 2013-04-24 DIAGNOSIS — Z8739 Personal history of other diseases of the musculoskeletal system and connective tissue: Secondary | ICD-10-CM | POA: Insufficient documentation

## 2013-04-24 DIAGNOSIS — F3289 Other specified depressive episodes: Secondary | ICD-10-CM | POA: Insufficient documentation

## 2013-04-24 DIAGNOSIS — F411 Generalized anxiety disorder: Secondary | ICD-10-CM | POA: Insufficient documentation

## 2013-04-24 DIAGNOSIS — B372 Candidiasis of skin and nail: Secondary | ICD-10-CM | POA: Insufficient documentation

## 2013-04-24 DIAGNOSIS — L853 Xerosis cutis: Secondary | ICD-10-CM

## 2013-04-24 LAB — CBC WITH DIFFERENTIAL/PLATELET
Basophils Absolute: 0.1 10*3/uL (ref 0.0–0.1)
HCT: 37 % (ref 36.0–46.0)
Hemoglobin: 12.4 g/dL (ref 12.0–15.0)
Lymphocytes Relative: 16 % (ref 12–46)
Monocytes Absolute: 0.8 10*3/uL (ref 0.1–1.0)
Neutro Abs: 5.9 10*3/uL (ref 1.7–7.7)
Neutrophils Relative %: 69 % (ref 43–77)
RDW: 14.9 % (ref 11.5–15.5)
WBC: 8.5 10*3/uL (ref 4.0–10.5)

## 2013-04-24 LAB — COMPREHENSIVE METABOLIC PANEL
ALT: 14 U/L (ref 0–35)
AST: 19 U/L (ref 0–37)
Albumin: 3.9 g/dL (ref 3.5–5.2)
Alkaline Phosphatase: 83 U/L (ref 39–117)
CO2: 25 mEq/L (ref 19–32)
Chloride: 96 mEq/L (ref 96–112)
Creatinine, Ser: 0.9 mg/dL (ref 0.50–1.10)
GFR calc non Af Amer: 66 mL/min — ABNORMAL LOW (ref >90)
Potassium: 4.1 mEq/L (ref 3.5–5.1)
Total Bilirubin: 0.4 mg/dL (ref 0.3–1.2)

## 2013-04-24 LAB — URINALYSIS, ROUTINE W REFLEX MICROSCOPIC
Bilirubin Urine: NEGATIVE
Glucose, UA: NEGATIVE mg/dL
Ketones, ur: NEGATIVE mg/dL
pH: 5.5 (ref 5.0–8.0)

## 2013-04-24 LAB — URINE MICROSCOPIC-ADD ON

## 2013-04-24 LAB — WET PREP, GENITAL
Clue Cells Wet Prep HPF POC: NONE SEEN
Trich, Wet Prep: NONE SEEN

## 2013-04-24 MED ORDER — ONDANSETRON HCL 4 MG PO TABS: 4.0000 mg | ORAL_TABLET | Freq: Four times a day (QID) | ORAL | Status: DC

## 2013-04-24 MED ORDER — ONDANSETRON HCL 4 MG/2ML IJ SOLN
4.0000 mg | Freq: Once | INTRAMUSCULAR | Status: AC
  Administered 2013-04-24: 4 mg via INTRAVENOUS
  Filled 2013-04-24: qty 2

## 2013-04-24 MED ORDER — HYDROXYZINE HCL 25 MG PO TABS: 25.0000 mg | ORAL_TABLET | Freq: Four times a day (QID) | ORAL | Status: DC

## 2013-04-24 MED ORDER — NYSTATIN POWD: 1.0000 "application " | Freq: Two times a day (BID) | Status: DC

## 2013-04-24 NOTE — ED Notes (Signed)
Here with abdominal pain off and on all week, rash for weeks and pain in her feet. States she fell at home a week ago. Her brother came to check on her. Sister states she may have a UTI and yeast infection.

## 2013-04-24 NOTE — ED Provider Notes (Signed)
CSN: [id number]     Arrival date & time 04/24/13  1424 History   First MD Initiated Contact with Patient 04/24/13 1640     Chief Complaint  Patient presents with  . Rash  . Foot Pain  . Abdominal Pain   (Consider location/radiation/quality/duration/timing/severity/associated sxs/prior Treatment) Patient is a 67 y.o. female presenting with rash and abdominal pain. The history is provided by the patient and a relative. No language interpreter was used.  Rash Location:  Torso, foot and shoulder/arm Shoulder/arm rash location:  L upper arm and R upper arm Torso rash location:  L chest, R chest and upper back Foot rash location:  Top of R foot Quality: itchiness   Severity:  Moderate Onset quality:  Unable to specify Timing:  Constant Progression:  Unchanged Chronicity:  New Context: not animal contact, not chemical exposure, not insect bite/sting, not medications, not new detergent/soap and not sick contacts   Relieved by:  Nothing Worsened by:  Nothing tried Ineffective treatments:  Topical steroids Associated symptoms: abdominal pain   Associated symptoms: no diarrhea, no fatigue, no fever, no headaches, no joint pain, no nausea, no shortness of breath, no sore throat and not vomiting   Abdominal pain:    Location:  Periumbilical   Quality:  Unable to specify   Severity:  Moderate   Onset quality:  Unable to specify   Duration:  10 days   Timing:  Intermittent   Progression:  Unchanged   Chronicity:  New Abdominal Pain Pain location:  Periumbilical Pain radiates to:  Does not radiate Pain severity:  Moderate Onset quality:  Unable to specify Duration:  10 days Timing:  Intermittent Progression:  Unchanged Chronicity:  New Context: not alcohol use, not awakening from sleep, not diet changes, not previous surgeries, not recent illness, not sick contacts, not suspicious food intake and not trauma   Relieved by:  Nothing Worsened by:  Nothing tried Ineffective treatments:   None tried Associated symptoms: vaginal discharge   Associated symptoms: no anorexia, no chest pain, no chills, no cough, no diarrhea, no dysuria, no fatigue, no fever, no melena, no nausea, no shortness of breath, no sore throat and no vomiting   Associated symptoms comment:  Decreased UOP Vaginal discharge:    Vaginal discharge characteristics: yellow.   Severity:  Mild   Onset quality:  Unable to specify   Timing:  Unable to specify   Progression:  Partially resolved   Chronicity:  New Risk factors: has not had multiple surgeries and no recent hospitalization     Past Medical History  Diagnosis Date  . Arthritis   . Thyroid disease   . Anxiety   . Hypertension   . Depression    History reviewed. No pertinent past surgical history. Family History  Problem Relation Age of Onset  . Heart disease Father   . Heart disease Brother   . Arthritis Brother   . Tuberculosis Maternal Grandmother   . Heart disease Maternal Grandfather   . Heart disease Paternal Grandmother   . Cancer Paternal Grandfather    History  Substance Use Topics  . Smoking status: Never Smoker   . Smokeless tobacco: Not on file  . Alcohol Use: No   OB History   Grav Para Term Preterm Abortions TAB SAB Ect Mult Living                 Review of Systems  Constitutional: Negative for fever, chills, diaphoresis, activity change, appetite change and fatigue.  HENT:  Negative for congestion, sore throat, facial swelling, rhinorrhea, neck pain and neck stiffness.   Eyes: Negative for photophobia and discharge.  Respiratory: Negative for cough, chest tightness and shortness of breath.   Cardiovascular: Negative for chest pain, palpitations and leg swelling.  Gastrointestinal: Positive for abdominal pain. Negative for nausea, vomiting, diarrhea, melena and anorexia.  Endocrine: Negative for polydipsia and polyuria.  Genitourinary: Positive for vaginal discharge. Negative for dysuria, frequency, difficulty  urinating and pelvic pain.  Musculoskeletal: Negative for back pain and arthralgias.  Skin: Positive for rash. Negative for color change and wound.  Allergic/Immunologic: Negative for immunocompromised state.  Neurological: Negative for facial asymmetry, weakness, numbness and headaches.  Hematological: Does not bruise/bleed easily.  Psychiatric/Behavioral: Negative for confusion and agitation.    Allergies  Augmentin and Sulfa drugs cross reactors  Home Medications   Current Outpatient Rx  Name  Route  Sig  Dispense  Refill  . clobetasol cream (TEMOVATE) 0.05 %   Topical   Apply topically daily. Do no use for more than 2 consecutive weeks   30 g   0   . clonazePAM (KLONOPIN) 0.5 MG tablet      TAKE 1 TABLET TWICE A DAY AS NEEDED   60 tablet   0   . furosemide (LASIX) 20 MG tablet      TAKE 1 TABLET TWICE A DAY AS NEEDED   60 tablet   3   . hydrOXYzine (ATARAX/VISTARIL) 25 MG tablet   Oral   Take 1 tablet (25 mg total) by mouth every 6 (six) hours.   12 tablet   0   . levothyroxine (SYNTHROID, LEVOTHROID) 25 MCG tablet   Oral   Take 1 tablet (25 mcg total) by mouth daily.   90 tablet   3   . lisinopril (PRINIVIL,ZESTRIL) 20 MG tablet   Oral   Take 1 tablet (20 mg total) by mouth daily.   90 tablet   3   . Nystatin POWD   Does not apply   1 application by Does not apply route 2 (two) times daily.   1 Bottle   0   . ondansetron (ZOFRAN) 4 MG tablet   Oral   Take 1 tablet (4 mg total) by mouth every 6 (six) hours.   12 tablet   0   . potassium chloride SA (K-DUR,KLOR-CON) 20 MEQ tablet   Oral   Take 1 tablet (20 mEq total) by mouth daily.   30 tablet   3   . venlafaxine XR (EFFEXOR XR) 150 MG 24 hr capsule   Oral   Take 1 capsule (150 mg total) by mouth daily.   30 capsule   3    BP 153/76  Pulse 81  Resp 18  Wt 180 lb (81.647 kg)  BMI 30.88 kg/m2  SpO2 100% Physical Exam  Constitutional: She is oriented to person, place, and time. She  appears well-developed and well-nourished. No distress.  HENT:  Head: Normocephalic and atraumatic.  Mouth/Throat: No oropharyngeal exudate.  Eyes: Pupils are equal, round, and reactive to light.  Neck: Normal range of motion. Neck supple.  Cardiovascular: Normal rate, regular rhythm and normal heart sounds.  Exam reveals no gallop and no friction rub.   No murmur heard. Pulmonary/Chest: Effort normal and breath sounds normal. No respiratory distress. She has no wheezes. She has no rales.  Abdominal: Soft. Bowel sounds are normal. She exhibits no distension and no mass. There is no tenderness. There is no rebound and  no guarding.  Genitourinary:  Vaginal wall erythema and friability, scant bloody d/c seen, none at cervix  Musculoskeletal: Normal range of motion. She exhibits no edema and no tenderness.  Neurological: She is alert and oriented to person, place, and time.  Skin: Skin is warm and dry.     Psychiatric:  Flat affect    ED Course  Procedures (including critical care time) Labs Review Labs Reviewed  WET PREP, GENITAL - Abnormal; Notable for the following:    WBC, Wet Prep HPF POC FEW (*)    All other components within normal limits  URINALYSIS, ROUTINE W REFLEX MICROSCOPIC - Abnormal; Notable for the following:    Leukocytes, UA TRACE (*)    All other components within normal limits  COMPREHENSIVE METABOLIC PANEL - Abnormal; Notable for the following:    Sodium 133 (*)    GFR calc non Af Amer 66 (*)    GFR calc Af Amer 76 (*)    All other components within normal limits  LIPASE, BLOOD - Abnormal; Notable for the following:    Lipase 60 (*)    All other components within normal limits  GC/CHLAMYDIA PROBE AMP  URINE MICROSCOPIC-ADD ON  CBC WITH DIFFERENTIAL   Imaging Review No results found.  MDM   1. Pain, abdominal, nonspecific   2. Dry skin dermatitis   3. Pruritus   4. Yeast dermatitis    Pt is a 67 y.o. female with Pmhx as above who presents with  about 1.5 weeks of intermittent ab pain, without assoc n/v, d/a, dysuria, fever.  She reports some vaginal itching/discharge, and dec UOP.  Pain not changed by PO intake, BM or urination.  On PE, VSS, pt in NAD. No ttp on abdominal exam. Pt also complains of multiple rashes, has dry/excoriated skin on upper back, top of R foot, as well as yeast appearing rash under skin folds of breast, and thinned skin of BL inner upper arms. CBC, unremarkable, CMP nml except Na 133, lipase very slightly elevated, but exam & hx not c/w pancreatitis, UA unremarkable.  Pelvic exam with erythema of vaginal, small amt of bleeding seen, likely due to atrophy, but cannot r/o blood from uterus.  Will refer to gyn for this issue. For pt's abdominal pain, given benign exam, I do not feel advanced imaging needed at this point and I doubt acute surgical emergency.  She has PCP appt scheduled for tomorrow and I feel she is safe for d/c and continued PCP w/u.  Return precautions given for new or worsening symptoms, have given Rx for zofran, nystatin powder, have asked she stop using steroid cream, and instead use moisturizing cream on back & foot.    1. Pain, abdominal, nonspecific   2. Dry skin dermatitis   3. Pruritus   4. Yeast dermatitis         Shanna Cisco, MD 04/25/13 1227

## 2013-04-25 ENCOUNTER — Ambulatory Visit (INDEPENDENT_AMBULATORY_CARE_PROVIDER_SITE_OTHER): Payer: Medicare Other | Admitting: Family Medicine

## 2013-04-25 ENCOUNTER — Encounter: Payer: Self-pay | Admitting: Family Medicine

## 2013-04-25 VITALS — BP 129/80 | HR 95 | Temp 97.2°F | Resp 16 | Ht 64.0 in | Wt 178.0 lb

## 2013-04-25 DIAGNOSIS — I10 Essential (primary) hypertension: Secondary | ICD-10-CM

## 2013-04-25 DIAGNOSIS — F32A Depression, unspecified: Secondary | ICD-10-CM

## 2013-04-25 DIAGNOSIS — R1013 Epigastric pain: Secondary | ICD-10-CM

## 2013-04-25 DIAGNOSIS — F411 Generalized anxiety disorder: Secondary | ICD-10-CM

## 2013-04-25 DIAGNOSIS — F329 Major depressive disorder, single episode, unspecified: Secondary | ICD-10-CM

## 2013-04-25 LAB — GC/CHLAMYDIA PROBE AMP: CT Probe RNA: NEGATIVE

## 2013-04-25 MED ORDER — CLONAZEPAM 0.5 MG PO TABS: ORAL_TABLET | ORAL | Status: DC

## 2013-04-25 MED ORDER — VENLAFAXINE HCL ER 150 MG PO CP24: 150.0000 mg | ORAL_CAPSULE | Freq: Every day | ORAL | Status: DC

## 2013-04-25 NOTE — Progress Notes (Signed)
S:  This 67 y.o. Cauc female was evaluated last night at [facility]. She had abd pain w/ nausea and a rash. Medications were prescribed and follow-up today. Pt was diagnosed w/ anxiety/GAD in July. Sertraline was prescribed But not well tolerated by pt. This med was changed to Effexor about 1 month ago and seems to be better tolerated. Lower ext edema was eval in August and ECHO ordered (I reviewed the results w/ pt and her sister-in-law); Amlodipine was stopped and Lisinopril resumed.The pt was referred to Cardiology for evaluation of irregular heart rhythm but she cancelled that appointment. She also had an appointment with GI (colonoscopy); she cancelled that appt also. Pt is concerned that she may have a complication as result of this procedure or she may not wake up. She is reminded that she had blood in her stool an, now with nausea and epig pain, she needs to have GI evaluation. She reluctantly agrees.  Patient Active Problem List   Diagnosis Date Noted  . Generalized anxiety disorder 02/16/2013  . Unspecified hypothyroidism 02/16/2013  . Raynaud's disease 02/16/2013  . Lower extremity venous stasis 02/16/2013    PMHx, Soc Hx and Fam Hx reviewed. Medications reconciled.  ROS: AS per HPI; no change since ED eval last pm.  O: Filed Vitals:   04/25/13 1028  BP: 129/80  Pulse: 95  Temp: 97.2 F (36.2 C)  Resp: 16   GEN: In NAD: WN,WD. HENT: [place]/AT; EOMI w/ clear conj/sclerae. EACs/nose normal. Oroph- mucosa dry. COR: RRR; normal S1 and S2 w/o m/g/r. LUNGS: CTA; no rales or wheezes. ABD: Soft; epig tenderness w/ moderate palpation. (pt examined in seated position- she did not want to lie down). SKIN: W&D; intact but sallow complexion. No erythema or ecchymoses. NEURO: A&O x 3; CNs intact. Mentation- pt is hesitant w/ speech (speech pattern is not fluent) and often stares ahead for several seconds when she is questioned or addressed; sister-in-law prompts her.    A/P: Generalized anxiety disorder- Refill Klonopin.  HTN (hypertension)- Stable and controlled on Lisinopril.  Abdominal pain, epigastric- Pt agrees to contact GI and reschedule consultation visit. Discussion of possible causes of abd pain and need for eval of Heme+ stools.  Depression - Plan: venlafaxine XR (EFFEXOR XR) 150 MG 24 hr capsule   Meds ordered this encounter  Medications  . clonazePAM (KLONOPIN) 0.5 MG tablet    Sig: TAKE 1 TABLET TWICE A DAY AS NEEDED    Dispense:  60 tablet    Refill:  0  . venlafaxine XR (EFFEXOR XR) 150 MG 24 hr capsule    Sig: Take 1 capsule (150 mg total) by mouth daily.    Dispense:  30 capsule    Refill:  3    Immunizations to be addressed at next visit; pt just turned 67 last month (Pneumovax candidate).

## 2013-04-25 NOTE — Patient Instructions (Addendum)
Contact the GI specialist to reschedule the consultation for colonoscopy and possible EGD (scope looking down into the stomach).  We can determine at a later date if you still need to see the Cardiologist (Heart specialist).

## 2013-05-05 ENCOUNTER — Other Ambulatory Visit: Payer: Self-pay | Admitting: Family Medicine

## 2013-05-13 ENCOUNTER — Other Ambulatory Visit: Payer: Self-pay | Admitting: Family Medicine

## 2013-05-17 ENCOUNTER — Other Ambulatory Visit: Payer: Self-pay | Admitting: Gastroenterology

## 2013-05-17 DIAGNOSIS — R109 Unspecified abdominal pain: Secondary | ICD-10-CM

## 2013-05-17 DIAGNOSIS — R110 Nausea: Secondary | ICD-10-CM

## 2013-05-18 ENCOUNTER — Other Ambulatory Visit: Payer: Self-pay | Admitting: Family Medicine

## 2013-05-22 ENCOUNTER — Ambulatory Visit
Admission: RE | Admit: 2013-05-22 | Discharge: 2013-05-22 | Disposition: A | Discharge status: Home / Self Care | Payer: Medicare Other | Source: Ambulatory Visit | Attending: Gastroenterology | Admitting: Gastroenterology

## 2013-05-22 DIAGNOSIS — R109 Unspecified abdominal pain: Secondary | ICD-10-CM

## 2013-05-22 DIAGNOSIS — R110 Nausea: Secondary | ICD-10-CM

## 2013-05-22 MED ORDER — IOHEXOL 300 MG/ML  SOLN
100.0000 mL | Freq: Once | INTRAMUSCULAR | Status: AC | PRN
  Administered 2013-05-22: 100 mL via INTRAVENOUS

## 2013-05-30 ENCOUNTER — Other Ambulatory Visit: Payer: Self-pay | Admitting: Family Medicine

## 2013-05-31 NOTE — Telephone Encounter (Signed)
Clonazepam refill phoned to pharmacy.

## 2013-06-13 ENCOUNTER — Other Ambulatory Visit: Payer: Self-pay | Admitting: Gastroenterology

## 2013-06-13 DIAGNOSIS — R1011 Right upper quadrant pain: Secondary | ICD-10-CM

## 2013-06-14 ENCOUNTER — Other Ambulatory Visit: Payer: Medicare Other

## 2013-06-19 ENCOUNTER — Ambulatory Visit
Admission: RE | Admit: 2013-06-19 | Discharge: 2013-06-19 | Disposition: A | Discharge status: Home / Self Care | Payer: Medicare Other | Source: Ambulatory Visit | Attending: Gastroenterology | Admitting: Gastroenterology

## 2013-06-19 DIAGNOSIS — R1011 Right upper quadrant pain: Secondary | ICD-10-CM

## 2013-06-29 ENCOUNTER — Ambulatory Visit: Payer: Medicare Other

## 2013-06-29 ENCOUNTER — Ambulatory Visit (INDEPENDENT_AMBULATORY_CARE_PROVIDER_SITE_OTHER): Payer: Medicare Other | Admitting: Family Medicine

## 2013-06-29 ENCOUNTER — Ambulatory Visit (HOSPITAL_COMMUNITY)
Admission: RE | Admit: 2013-06-29 | Discharge: 2013-06-29 | Disposition: A | Discharge status: Home / Self Care | Payer: Medicare Other | Source: Ambulatory Visit | Attending: Family Medicine | Admitting: Family Medicine

## 2013-06-29 ENCOUNTER — Other Ambulatory Visit: Payer: Self-pay | Admitting: Family Medicine

## 2013-06-29 ENCOUNTER — Encounter: Payer: Self-pay | Admitting: Family Medicine

## 2013-06-29 VITALS — BP 120/64 | HR 102 | Temp 97.8°F | Resp 16 | Ht 65.25 in | Wt 179.0 lb

## 2013-06-29 DIAGNOSIS — M7989 Other specified soft tissue disorders: Secondary | ICD-10-CM

## 2013-06-29 DIAGNOSIS — M79609 Pain in unspecified limb: Secondary | ICD-10-CM

## 2013-06-29 DIAGNOSIS — M25561 Pain in right knee: Secondary | ICD-10-CM

## 2013-06-29 DIAGNOSIS — I878 Other specified disorders of veins: Secondary | ICD-10-CM

## 2013-06-29 DIAGNOSIS — R609 Edema, unspecified: Secondary | ICD-10-CM | POA: Insufficient documentation

## 2013-06-29 DIAGNOSIS — L309 Dermatitis, unspecified: Secondary | ICD-10-CM

## 2013-06-29 DIAGNOSIS — M25569 Pain in unspecified knee: Secondary | ICD-10-CM

## 2013-06-29 DIAGNOSIS — L539 Erythematous condition, unspecified: Secondary | ICD-10-CM | POA: Insufficient documentation

## 2013-06-29 DIAGNOSIS — L259 Unspecified contact dermatitis, unspecified cause: Secondary | ICD-10-CM

## 2013-06-29 MED ORDER — DOXYCYCLINE HYCLATE 100 MG PO TABS: 100.0000 mg | ORAL_TABLET | Freq: Two times a day (BID) | ORAL | Status: DC

## 2013-06-29 NOTE — Progress Notes (Signed)
This pt was seen this morning and had unilateral edema of R lower leg. Venous duplex study this afternoon which called report indicated was negative. I will prescribe an antibiotic for 10 days and see pt back as scheduled.

## 2013-06-29 NOTE — Progress Notes (Signed)
VASCULAR LAB PRELIMINARY  PRELIMINARY  PRELIMINARY  PRELIMINARY  Right lower extremity venous duplex completed.    Preliminary report:  Right:  No evidence of DVT, superficial thrombosis, or Baker's cyst.  Nestor Wieneke, RVS 06/29/2013, 3:20 PM

## 2013-06-29 NOTE — Progress Notes (Signed)
S:  This 67 y.o. Cauc female has chronic anxiety and was started on Effexor in August 2014. She presents today w/ family member who reports onset of intense itchy rash w/ redness for at least 2 months. Review of notes shows pt was in ED with this rash in mid-September 2014. Family member thinks Effexor may be the culprit. Pt has not changed skin care products or had any chemical exposure to explain rash. She has no known food allergies.  Family member reports pt fell a few weeks ago and now c/o R lower ext swelling with pain x 2 weeks. She has chronic bilateral  lower ext edema, treated w/ elevation and Furosemide. Pt has Raynaud's disease and has chronic discoloration of digits on both feet. This has been worse w/ cold weather.  Pt c/o cloudy urine but no dysuria, hematuria or incontinence.   Patient Active Problem List   Diagnosis Date Noted  . Generalized anxiety disorder 02/16/2013  . Unspecified hypothyroidism 02/16/2013  . Raynaud's disease 02/16/2013  . Lower extremity venous stasis 02/16/2013   PMHx, Soc and Fam Hx reviewed.  Medications reconciled.  ROS: As per HPI. Otherwise, noncontributory.  O: Filed Vitals:   06/29/13 1129  BP: 120/64  Pulse: 102  Temp: 97.8 F (36.6 C)  Resp: 16   GEN: In NAD; appears uncomfortable. HENT: [place]/AT; EOMI w/ clear conj/sclerae. EACs/nose/oroph unremarkable. SKIN: Diffuse erythema noted on shoulders and torso; extremities have discrete red papules and excoriations. Digits and soles of feet- cyanotic but warm-to-touch. R lower leg is edematous and erythematous. MS: R lower leg- swollen w/ calf tenderness upon palpation. Pt can wiggle toes. NEURO: A&O X 3; CNs intact. Gait- w/ walker favors R leg. PSYCH: Flat and blunted affect. Inconsistent attentiveness. No agitation or confusion. Speech is slowed, not pressured or tangential.    [facility] reading (PRIMARY) by  Dr. Audria Nine: R lower leg- no fracture of dislocation in tibia or fibula.  Questionable soft tissue swelling.   A/P: Pain in joint, lower leg, right - Plan: DG Tibia/Fibula Right  Swelling of limb - Plan: DG Tibia/Fibula Right, Lower Extremity Venous Duplex Right  Dermatitis- Reduce Effexor 150 mg t 1 capsule every other day; will reduce dose further upon follow-up in 2 -3 weeks. RTC prn or with worsening symptoms.

## 2013-06-29 NOTE — Patient Instructions (Addendum)
Your have an appointment today at 2 PM at [facility]. Go to [facility], [address] off [address] Pull into [address] parking and they will park your car. GO TO ADMITTING TO REGISTER.   Decrease EFFEXOR to every other day until I see you again in 2 weeks.

## 2013-06-30 ENCOUNTER — Telehealth: Payer: Self-pay

## 2013-06-30 NOTE — Telephone Encounter (Signed)
Pt is having severe itching. Saw dr Audria Nine yesterday and was sent to hospital for a ct. Also has cellulitus. Can you call her in something for the itching?  [facility] on [address].

## 2013-06-30 NOTE — Telephone Encounter (Signed)
Can we call in something to help with her itching?

## 2013-07-02 ENCOUNTER — Other Ambulatory Visit: Payer: Self-pay | Admitting: Family Medicine

## 2013-07-02 NOTE — Telephone Encounter (Signed)
Would advise Zyrtec qam and Benadryl qhs.  Cool compresses will also help with itching.  If symptoms worsening, needs to RTC per Dr. Angelyn Punt note

## 2013-07-02 NOTE — Telephone Encounter (Signed)
Thanks, I have called her to advise. She states she can not use benadryl. Advised her to use Zyrtec 10mg  in the am.

## 2013-07-03 NOTE — Telephone Encounter (Signed)
Clonazepam refill phoned to pt's pharmacy. 

## 2013-07-08 ENCOUNTER — Telehealth: Payer: Self-pay

## 2013-07-08 ENCOUNTER — Other Ambulatory Visit: Payer: Self-pay | Admitting: Family Medicine

## 2013-07-08 NOTE — Telephone Encounter (Signed)
I think it would be best for patient to RTC if Zyrtec and cool compresses are not helping.  Itching is typically a sign of healing but it would be best for [place] to see it to make sure that is the case.

## 2013-07-08 NOTE — Telephone Encounter (Signed)
Patient is itching around cellulitis. Took zyrtec which is not helping. Doesn't know what else to do?

## 2013-07-09 NOTE — Telephone Encounter (Signed)
Called patient to advise  °

## 2013-07-10 ENCOUNTER — Ambulatory Visit (INDEPENDENT_AMBULATORY_CARE_PROVIDER_SITE_OTHER): Payer: Medicare Other | Admitting: Family Medicine

## 2013-07-10 ENCOUNTER — Encounter: Payer: Self-pay | Admitting: Family Medicine

## 2013-07-10 VITALS — BP 144/76 | HR 84 | Temp 97.7°F | Resp 16 | Ht 65.5 in | Wt 176.0 lb

## 2013-07-10 DIAGNOSIS — T50995A Adverse effect of other drugs, medicaments and biological substances, initial encounter: Secondary | ICD-10-CM

## 2013-07-10 DIAGNOSIS — L309 Dermatitis, unspecified: Secondary | ICD-10-CM

## 2013-07-10 DIAGNOSIS — L259 Unspecified contact dermatitis, unspecified cause: Secondary | ICD-10-CM

## 2013-07-10 MED ORDER — HYDROXYZINE PAMOATE 25 MG PO CAPS: ORAL_CAPSULE | ORAL | Status: DC

## 2013-07-10 NOTE — Patient Instructions (Signed)
LASIX (furosemide) possible allergy- this medication has some cross- reactivity with SULFA medications to which you are allergic. The medication can cause a RASH. STOP taking this medication and I will see you next week as scheduled. I have prescribed Hydroxyzine 25 mg  1 capsule three times a day for itch and anxiety.

## 2013-07-11 ENCOUNTER — Encounter: Payer: Self-pay | Admitting: Family Medicine

## 2013-07-11 NOTE — Progress Notes (Signed)
S:  This 67 y.o. Cauc female returns for follow-up; she continues to be distressed about diffuse dermatitis with intense itching. She is compliant with all medications. Clonazepam is ineffective. Family member reports chronic hx of dermatitis dating back years; pt has been evaluated by Dr. Terri Piedra and diagnosed w/ eczematous dermatitis. Pt's caregiver has tried multiple lotions and creams w/o relief. R lower extremity has persistent edema. Recent LE venous doppler study was negative for DVT.  Patient Active Problem List   Diagnosis Date Noted  . Generalized anxiety disorder 02/16/2013  . Unspecified hypothyroidism 02/16/2013  . Raynaud's disease 02/16/2013  . Lower extremity venous stasis 02/16/2013   PMHx, Soc and Fam Hx reviewed.  Medications reconciled.  ROS: As per HPI. Negative for fever/chills, sore throat or other upper resp symptoms, cough, SOB or DOE,  n/vd, myalgias, paresthesias, HA, dizziness, tremor or syncope.  O: Filed Vitals:   07/10/13 1521  BP: 144/76  Pulse: 84  Temp: 97.7 F (36.5 C)  Resp: 16   GEN: In mild distress; WN,WD.  HENT: [place]/AT; EOMI w/ mild conj injection and clear sclerae. Oroph clear and moist; no lesions, fair dentition. SKIN: Diffuse erythema w/ macular lesions and flaking; no vesicular lesions. MS: R lower ext w/ genralized edema compared to L lower ext. NEURO: A&O x 3; CNs intact. Flat, blunted affect. Speech is delayed but not tangential; pt occasionally inattentive. Thought content is normal and judgement is sound.  A/P: Chronic dermatitis- Suspect chronic eczema with exacerbation due to drug allergy/cross reactivity w/ SULFA. Will discontinue Furosemide; trial of hydroxyzine for itching as well as soothing topicals. Do not take Clonazepam while take Hydroxyzine.  Unspecified adverse effect of other drug, medicinal and biological substance(995.29)- Suspect cross-reactivity of Furosemide w/ SULFA allergy.  Meds ordered this encounter  Medications   . hydrOXYzine (VISTARIL) 25 MG capsule    Sig: Take 1 capsule three times a day for itching and anxiety.    Dispense:  40 capsule    Refill:  0

## 2013-07-20 ENCOUNTER — Encounter: Payer: Self-pay | Admitting: Family Medicine

## 2013-07-20 ENCOUNTER — Ambulatory Visit (INDEPENDENT_AMBULATORY_CARE_PROVIDER_SITE_OTHER): Payer: Medicare Other | Admitting: Family Medicine

## 2013-07-20 ENCOUNTER — Telehealth: Payer: Self-pay | Admitting: Family Medicine

## 2013-07-20 ENCOUNTER — Ambulatory Visit: Payer: Medicare Other

## 2013-07-20 VITALS — BP 133/71 | HR 93 | Temp 96.7°F | Resp 16 | Ht 65.5 in | Wt 179.0 lb

## 2013-07-20 DIAGNOSIS — G8929 Other chronic pain: Secondary | ICD-10-CM

## 2013-07-20 DIAGNOSIS — R609 Edema, unspecified: Secondary | ICD-10-CM

## 2013-07-20 DIAGNOSIS — M545 Low back pain, unspecified: Secondary | ICD-10-CM

## 2013-07-20 DIAGNOSIS — F411 Generalized anxiety disorder: Secondary | ICD-10-CM

## 2013-07-20 DIAGNOSIS — F329 Major depressive disorder, single episode, unspecified: Secondary | ICD-10-CM

## 2013-07-20 DIAGNOSIS — L309 Dermatitis, unspecified: Secondary | ICD-10-CM

## 2013-07-20 DIAGNOSIS — R269 Unspecified abnormalities of gait and mobility: Secondary | ICD-10-CM

## 2013-07-20 DIAGNOSIS — R600 Localized edema: Secondary | ICD-10-CM

## 2013-07-20 DIAGNOSIS — F32A Depression, unspecified: Secondary | ICD-10-CM

## 2013-07-20 DIAGNOSIS — L259 Unspecified contact dermatitis, unspecified cause: Secondary | ICD-10-CM

## 2013-07-20 MED ORDER — HYDROXYZINE PAMOATE 25 MG PO CAPS: ORAL_CAPSULE | ORAL | Status: DC

## 2013-07-20 MED ORDER — VENLAFAXINE HCL ER 150 MG PO CP24: 150.0000 mg | ORAL_CAPSULE | Freq: Every day | ORAL | Status: DC

## 2013-07-20 MED ORDER — TORSEMIDE 10 MG PO TABS: ORAL_TABLET | ORAL | Status: DC

## 2013-07-20 NOTE — Patient Instructions (Signed)
I have placed a referral to Dr. Terri Piedra to evaluate this chronic rash. I have increased Hydroxyzine 25 mg to 2 capsules 3 times daily for itch and anxiety.  Take Effexor every day to help control anxiety.  The low back pain and spine problem will need to be further evaluated at visit after I have the final reading from the radiologist.

## 2013-07-20 NOTE — Telephone Encounter (Signed)
Patient called because she was prescribed torsemide earlier today. She wanted to know if this medication has sulfa in it because she is allergic to sulfa. She already picked up medication from pharmacy. Torsemide does have sulfonamide allergy contraindication. Spoke with Dr. Audria Nine and she said to tell patient not to take medication and she will talk with pharmacy tomorrow and change medication. Patient notified not to take medication and wait to hear from Dr. Audria Nine and/or pharmacy tomorrow. Also notified patient to contact pharmacy and make sure they have most up to date allergy list for her. She was notified and voiced understanding.

## 2013-07-20 NOTE — Progress Notes (Signed)
Subjective:    Patient ID: Christy Luna, female    DOB: May 03, 1946, 67 y.o.   MRN: [id number]  HPI  This 67 y.o. Cauc female returns w/ family member for further evaluation of dermatitis and intense pruritis. The rash is chronic but, in last few months, has become generalized w/ pruritis. Family member w/ pt thinks rash has resolved a little. Furosemide was discontinued as drug intolerance/ SULFA cross reactivity was suspected. Hydroxyzine was prescribed for itching and to relieve anxiety, which is not responding to Clonazepam. Effexor was decreased to every other day on 06/29/13 due to concern that pt may have an intolerance or sensitivity to this medication. Her family member reports poor sleep hygiene, pt having called her before 6 AM one morning since dose reduction. When anxious and agitated, she will call several times daily.   Pt also very concerned about leg edema, R>>L. Since she stopped taking Furosemide, the swelling is increased. Pt states she elevates legs during the day as directed.No reports of SOB or DOE, cough or orthopnea. Because of unilateral/asymmetric edema, I reviewed a CT abd/pelvis performed in Oct 2014 per Dr. Kenna Gilbert order; there were no suspicious masses or explanation for LEE.  Pt c/o LBP, requirng a walker for gait stability. No hx of trauma or falls. Pt does report occasional numbness radiating into lower extremities. Not having saddle paresthesias, incontinence of bladder or bowel.  PMHx, Soc and Fam Hx reviewed.   Medications reconciled.   Review of Systems  Constitutional: Negative for fever, diaphoresis, activity change, appetite change and unexpected weight change.  HENT: Negative.   Eyes: Negative.   Respiratory: Negative for cough, chest tightness, shortness of breath and wheezing.   Cardiovascular: Positive for leg swelling. Negative for palpitations.  Endocrine: Negative for polydipsia, polyphagia and polyuria.  Genitourinary: Negative.     Musculoskeletal: Positive for back pain, gait problem and myalgias.  Skin: Positive for rash.  Neurological: Positive for weakness.  Psychiatric/Behavioral: Positive for sleep disturbance and dysphoric mood. Negative for suicidal ideas, self-injury and agitation. The patient is nervous/anxious.       Objective:   Physical Exam  Nursing note and vitals reviewed. Constitutional: She is oriented to person, place, and time. She appears well-developed and well-nourished. No distress.  Appears older than stated age.  HENT:  Head: Normocephalic and atraumatic.  Right Ear: Hearing and external ear normal.  Left Ear: Hearing and external ear normal.  Nose: Nose normal.  Mouth/Throat: Uvula is midline, oropharynx is clear and moist and mucous membranes are normal.  Eyes: Conjunctivae and EOM are normal. No scleral icterus.  Cardiovascular: Normal rate and regular rhythm.   Pulmonary/Chest: Effort normal and breath sounds normal. No respiratory distress. She has no wheezes.  Abdominal: Soft. She exhibits no distension and no mass. There is no hepatosplenomegaly. There is no tenderness. There is no guarding and no CVA tenderness.  Musculoskeletal:       Right lower leg: She exhibits tenderness and edema.       Left lower leg: She exhibits tenderness and edema.       Right foot: She exhibits swelling and decreased capillary refill.  R lower leg >> L lower leg edema- pitting.  Lymphadenopathy:    She has no cervical adenopathy.  Neurological: She is alert and oriented to person, place, and time. No cranial nerve deficit or sensory deficit. Gait abnormal.  Skin: Skin is warm and dry. Rash noted. Rash is urticarial. She is not diaphoretic.    [facility]  reading (PRIMARY) by  Dr. 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     Assessment & Plan:  Generalized anxiety disorder- This results in poor sleep hygiene and symptoms worsened on every other day  Venlafaxine; resume daily Venlafaxine XR 150 mg and will increase Hydroxyzine to 25 mg  2 capsules 3 times a day.  Chronic low back pain - Plan: DG Lumbar Spine 2-3 Views  Gait disturbance - Monitor; pt ambulates w/ a walker but has a shuffling gait.  Plan: DG Lumbar Spine 2-3 Views  Dermatitis - Suspect pt may have generalized eczematous dermatitis; needs biopsy. Increase Hydroxyzine 25 mg to 2 capsules 2-3 times daily. Plan: Ambulatory referral to Dermatology- pt is established w/ Dr. Terri Piedra.  Edema, lower extremity-  Right leg >> L leg: CT abdomen (05/18/2013) showed no significant masses or enlarged organs; 7.2 mm complex cyst on L kidney to be reassessed in 4-5 months. Trial Demadex (torsemide 10 mg 1 tablet daily).   Depression - Plan: venlafaxine XR (EFFEXOR XR) 150 MG 24 hr capsule   Meds ordered this encounter  Medications  . DISCONTD: torsemide (DEMADEX) 10 MG tablet    Sig: Take 1 tablet by mouth every morning.    Dispense:  30 tablet    Refill:  1  . venlafaxine XR (EFFEXOR XR) 150 MG 24 hr capsule    Sig: Take 1 capsule (150 mg total) by mouth daily.    Dispense:  30 capsule    Refill:  3  . hydrOXYzine (VISTARIL) 25 MG capsule    Sig: Take 2 capsules three times a day for itching and anxiety.    Dispense:  100 capsule    Refill:  0

## 2013-07-21 MED ORDER — SPIRONOLACTONE 25 MG PO TABS: ORAL_TABLET | ORAL | Status: DC

## 2013-07-21 NOTE — Telephone Encounter (Signed)
I spoke w/ pt about Torsemide prescribed yesterday; she has not taken medication due to hx of SULFA cross reactivity/ ? Allergy. Legs are still swollen; pt reports she reclines from time to time during the day but that does not seem to help. She is still itching despite increased dose of Hydroxyzine 2 capsules 3 times daily. I advised her that I would call in a different medication for the lower ext swelling and check back on her next week. She is advised to contact office next week for appt if itching does not subside. She understands.  I phoned prescription for Spironolactone 25mg   # 30  w/ [facility]

## 2013-07-25 ENCOUNTER — Telehealth: Payer: Self-pay

## 2013-07-25 NOTE — Telephone Encounter (Signed)
We can provide the container, for patient to collect at home, but patient will need exam , she will need to come in WITH the urine. Advised caretaker. Caretaker states since she has been on the vistaril she has been wetting the bed. Advised often this medication can be sedating, she may want to stop the medication and see if it resolves since she has no other symptoms. Caretaker agrees.

## 2013-07-25 NOTE — Telephone Encounter (Signed)
PTS CARETAKER IS REQUESTING TO PICK UP SAMPLE CUP TO COLLECT URINE FOR THIS PT,SHE FEELS PT HAS URINARY TRACT INFECTION(PT IS PEEING THE BED)PT CANNOT GO TO BATHROOM WHEN SHE IS IN OUR OFFICE.   BEST PHONE FOR CARETAKER [facility] IS [phone] OR FOR [place]  [phone]

## 2013-08-13 ENCOUNTER — Telehealth: Payer: Self-pay

## 2013-08-13 NOTE — Telephone Encounter (Signed)
Patients CNA Ivan Anchors is calling concerned about patients condition on venlafaxine XR (EFFEXOR XR) 150 MG 24 hr capsule - anti depressant. She seems to have lost the will to live and is very anxious all the time. CNA reports that patient is very scared and very anxious. CNA is concerned that this might be the wrong type of medication to treat her, in her care she feels that she spends a lot of time with her and seen a drastic mood change. Please advise . CNA Dois Davenport also calling in regards to patients rash medication- hydroxycene makes her fall a lot and makes her extremely drowsy. They are still going to see the dermatologist.     [phone]  Dois Davenport the CNA: [phone]

## 2013-08-14 ENCOUNTER — Telehealth: Payer: Self-pay | Admitting: Family Medicine

## 2013-08-14 ENCOUNTER — Telehealth: Payer: Self-pay | Admitting: Radiology

## 2013-08-14 NOTE — Telephone Encounter (Signed)
I have advised her previously to d/c the hydroxyzine, the patient and the CNA. See phone message from 07/25/13. Can move up the appt. 9:15am on Thursday. Have advised Dois Davenport and she will get transportation arranged. Advised CNA to make sure she does not take the hydroxyzine.

## 2013-08-14 NOTE — Telephone Encounter (Signed)
Advise to stop the Hydroxyzine (medication for itching). Pt has appt 08/24/13; see if that can be rescheduled to this week. I will try to call the CNA this afternoon.

## 2013-08-14 NOTE — Telephone Encounter (Signed)
If pt cannot make it to the appt on Thursday, pt may need to be seen at [facility] for evaluation. She does need 30 minutes but you can put here in the open 15- minute appointment.

## 2013-08-14 NOTE — Telephone Encounter (Signed)
I spoke with Ivan Anchors, caregiver. I called Ms. Loveda ~ 6:20 PM and left a message encouraging appt this week. I advised caregiver to have pt seen at [facility] at ED if she is not seen at [facility] this week. Caregiver wanted to discuss chronic dermatitis of lower extremities (pt known to have stasis dermatitis); DERM appt is pending.

## 2013-08-14 NOTE — Telephone Encounter (Signed)
Patients care taker has called back, the appointment given will not work. We have offered Thursday at 9:15. This is a 30 min spot. The only other spot available is for 15 minutes, but is in the afternoon. Do you want [place] to put her in the 15 min spot? I think she needs 30. Please advise. (or we can change back to the original appt time)

## 2013-08-15 ENCOUNTER — Telehealth: Payer: Self-pay

## 2013-08-15 NOTE — Telephone Encounter (Signed)
Called and moved the appointment to tomorrow at 2:45, instead of at 9:15 called Dois Davenport to advise [phone], but spoke to [place] instead. Jaydn said she can't come at this time either. Called Bonita Quin, her sister in Social worker. Spoke to [place] to advise. Bonita Quin will try to have patient come in, patient states it is too cold.

## 2013-08-15 NOTE — Telephone Encounter (Signed)
Error

## 2013-08-16 ENCOUNTER — Ambulatory Visit: Payer: Medicare Other | Admitting: Family Medicine

## 2013-08-24 ENCOUNTER — Ambulatory Visit: Payer: Medicare Other | Admitting: Family Medicine

## 2013-08-24 ENCOUNTER — Ambulatory Visit (INDEPENDENT_AMBULATORY_CARE_PROVIDER_SITE_OTHER): Payer: Medicare HMO | Admitting: Family Medicine

## 2013-08-24 ENCOUNTER — Encounter: Payer: Self-pay | Admitting: Family Medicine

## 2013-08-24 VITALS — BP 110/84 | HR 94 | Temp 97.4°F | Resp 20 | Ht 65.0 in | Wt 165.0 lb

## 2013-08-24 DIAGNOSIS — I10 Essential (primary) hypertension: Secondary | ICD-10-CM

## 2013-08-24 DIAGNOSIS — F411 Generalized anxiety disorder: Secondary | ICD-10-CM

## 2013-08-24 DIAGNOSIS — R32 Unspecified urinary incontinence: Secondary | ICD-10-CM

## 2013-08-24 DIAGNOSIS — E039 Hypothyroidism, unspecified: Secondary | ICD-10-CM

## 2013-08-24 MED ORDER — CLONAZEPAM 0.5 MG PO TABS: 0.5000 mg | ORAL_TABLET | Freq: Two times a day (BID) | ORAL | Status: DC

## 2013-08-24 MED ORDER — LISINOPRIL 20 MG PO TABS: 20.0000 mg | ORAL_TABLET | Freq: Every day | ORAL | Status: DC

## 2013-08-24 MED ORDER — LEVOTHYROXINE SODIUM 25 MCG PO TABS: 25.0000 ug | ORAL_TABLET | Freq: Every day | ORAL | Status: DC

## 2013-08-24 NOTE — Patient Instructions (Addendum)
You need to collect a urine specimen at home; get a good clean catch urine on one morning next week and return it to [address]. Refrigerate the specimen if you cannot bring it back within 1 hour of collection.    Urinary Incontinence Urinary incontinence is the involuntary loss of urine from your bladder. CAUSES  There are many causes of urinary incontinence. They include:  Medicines.  Infections.  Prostatic enlargement, leading to overflow of urine from your bladder.  Surgery.  Neurological diseases.  Emotional factors. SIGNS AND SYMPTOMS Urinary Incontinence can be divided into four types: 1. Urge incontinence. Urge incontinence is the involuntary loss of urine before you have the opportunity to go to the bathroom. There is a sudden urge to void but not enough time to reach a bathroom. 2. Stress incontinence. Stress incontinence is the sudden loss of urine with any activity that forces urine to pass. It is commonly caused by anatomical changes to the pelvis and sphincter areas of your body. 3. Overflow incontinence. Overflow incontinence is the loss of urine from an obstructed opening to your bladder. This results in a backup of urine and a resultant buildup of pressure within the bladder. When the pressure within the bladder exceeds the closing pressure of the sphincter, the urine overflows, which causes incontinence, similar to water overflowing a dam. 4. Total incontinence. Total incontinence is the loss of urine as a result of the inability to store urine within your bladder. DIAGNOSIS  Evaluating the cause of incontinence may require:  A thorough and complete medical and obstetric history.  A complete physical exam.  Laboratory tests such as a urine culture and sensitivities. When additional tests are indicated, they can include:  An ultrasound exam.  Kidney and bladder X-rays.  Cystoscopy. This is an exam of the bladder using a narrow scope.  Urodynamic testing to  test the nerve function to the bladder and sphincter areas. TREATMENT  Treatment for urinary incontinence depends on the cause:  For urge incontinence caused by a bacterial infection, antibiotics will be prescribed. If the urge incontinence is related to medicines you take, your health care provider may have you change the medicine.  For stress incontinence, surgery to re-establish anatomical support to the bladder or sphincter, or both, will often correct the condition.  For overflow incontinence caused by an enlarged prostate, an operation to open the channel through the enlarged prostate will allow the flow of urine out of the bladder. In women with fibroids, a hysterectomy may be recommended.  For total incontinence, surgery on your urinary sphincter may help. An artificial urinary sphincter (an inflatable cuff placed around the urethra) may be required. In women who have developed a hole-like passage between their bladder and vagina (vesicovaginal fistula), surgery to close the fistula often is required. HOME CARE INSTRUCTIONS  Normal daily hygiene and the use of pads or adult diapers that are changed regularly will help prevent odors and skin damage.  Avoid caffeine. It can overstimulate your bladder.  Use the bathroom regularly. Try about every 2 3 hours to go to the bathroom, even if you do not feel the need to do so. Take time to empty your bladder completely. After urinating, wait a minute. Then try to urinate again.  For causes involving nerve dysfunction, keep a log of the medicines you take and a journal of the times you go to the bathroom. SEEK MEDICAL CARE IF:  You experience worsening of pain instead of improvement in pain after your  procedure.  Your incontinence becomes worse instead of better. SEE IMMEDIATE MEDICAL CARE IF:  You experience fever or shaking chills.  You are unable to pass your urine.  You have redness spreading into your groin or down into your  thighs. MAKE SURE YOU:   Understand these instructions.   Will watch your condition.  Will get help right away if you are not doing well or get worse. Document Released: 09/02/2004 Document Revised: 05/16/2013 Document Reviewed: 01/02/2013 [facility] Patient Information 2014 [place], [place].

## 2013-08-27 NOTE — Progress Notes (Signed)
S: This 68 y.o. Cauc female is here for follow-up re: chronic anxiety w/ some depressive symptoms. Current medication is Venlafaxine 150 mg daily; pt's caretaker, Dois Davenport, has written a note stating that she thinks "this medication dose is too high and is not the proper medicine". Note states pt has been worried about her legs and the itching and her anxiety levels are still high. The pt has been taking Clonazepam as needed but her sister-in=law, who accompanies her today, sates that pt waits to long to take medication.  The pt has been evaluated by the dermatologist and her skin is much better; she is not c/o itching today. Her family member reports that she is resting better and seems to be a little less anxious. The dermatologist prescribed Prednisone and a topical agent; follow-up appt is 3 weeks.   HTN- pt is compliant w/ medication and BP is well controlled. Not c/o CP or tightness, palpitations, SOB or DOE, cough, HA, dizziness, numbness or syncope. Pt has chronic lower ext edema and this has actually improved some since last visit when Spironolctone was prescribed. Caregiver's note states that pt not urinating as much. Pt has lost weight; family member attributed this to fluid loss and less leg edema.  Pt is compliant w/ thyroid medication; she does not report any changes of energy level, stool changes or vision disturbances.  Family member c/o urinary incontinence; this is a chronic issue. Attempts to collect urine in the office have been unsuccessful.  Patient Active Problem List   Diagnosis Date Noted  . Dermatitis 07/20/2013  . Generalized anxiety disorder 02/16/2013  . Unspecified hypothyroidism 02/16/2013  . Raynaud's disease 02/16/2013  . Lower extremity venous stasis 02/16/2013   PMHx, Soc Hx and Fam Hx reviewed.  Medications reconciled.  ROS: As per HPI.   O: Filed Vitals:   08/24/13 1031  BP: 110/84  Pulse: 94  Temp: 97.4 F (36.3 C)  Resp: 20   GEN; In NAD; WN,WD.  Weight down 14 lbs. HEN: [place]/AT; EOMI w/ clear conj/sclerae. Otherwise unremarkable. COR: RRR. Lower ext- R w/ 1+ pretibial edema; L w/ trace pretibial edema. LUNGS: CTA; unlabored resp. SKIN: Minimal erythema around neck and upper extremities. Lower legs- chronic dermatitis resolving w/ mild erythema; no signs of infection. NEURO: A&O x 3; CNs intact. Gait is slowed and slightly shuffling w/ walker. PSYCH: Pleasant and calm; pt is smiling and attentive. Speech is still slow and hesitant.  A/P: Generalized anxiety disorder- Continue Venlafaxine 150 mg daily. Instruct pt to take Clonazepam twice a day everyday.  Essential hypertension, benign - Stable and well controlled. Plan: lisinopril (PRINIVIL,ZESTRIL) 20 MG tablet  Hypothyroid - No medication change at this time.   Plan: levothyroxine (SYNTHROID, LEVOTHROID) 25 MCG tablet  Urinary incontinence - Pt and family member instructed to collect 1st morning specimen urine at home and bring to office for UA. Pt and family member voice understanding.   Plan: POCT urinalysis dipstick (future).  Meds ordered this encounter  Medications  . triamcinolone cream (KENALOG) 0.1 %    Sig: Apply 1 application topically 2 (two) times daily.  Marland Kitchen PRESCRIPTION MEDICATION    Sig: Prednisone 5 mg tapering dose x 1 week per Dr. Terri Piedra  . lisinopril (PRINIVIL,ZESTRIL) 20 MG tablet    Sig: Take 1 tablet (20 mg total) by mouth daily.    Dispense:  30 tablet    Refill:  5  . levothyroxine (SYNTHROID, LEVOTHROID) 25 MCG tablet    Sig: Take 1 tablet (25  mcg total) by mouth daily.    Dispense:  30 tablet    Refill:  5    Order Specific Question:  Supervising Provider    Answer:  DOOLITTLE, ROBERT P [[id number]]  . clonazePAM (KLONOPIN) 0.5 MG tablet    Sig: Take 1 tablet (0.5 mg total) by mouth 2 (two) times daily.    Dispense:  60 tablet    Refill:  2

## 2013-10-15 ENCOUNTER — Other Ambulatory Visit: Payer: Self-pay | Admitting: Family Medicine

## 2013-10-26 ENCOUNTER — Ambulatory Visit (INDEPENDENT_AMBULATORY_CARE_PROVIDER_SITE_OTHER): Payer: Medicare HMO | Admitting: Family Medicine

## 2013-10-26 ENCOUNTER — Encounter: Payer: Self-pay | Admitting: Family Medicine

## 2013-10-26 VITALS — BP 122/80 | HR 83 | Temp 97.9°F | Resp 16 | Ht 64.0 in | Wt 163.2 lb

## 2013-10-26 DIAGNOSIS — I7300 Raynaud's syndrome without gangrene: Secondary | ICD-10-CM

## 2013-10-26 DIAGNOSIS — I878 Other specified disorders of veins: Secondary | ICD-10-CM

## 2013-10-26 DIAGNOSIS — I872 Venous insufficiency (chronic) (peripheral): Secondary | ICD-10-CM

## 2013-10-26 DIAGNOSIS — E039 Hypothyroidism, unspecified: Secondary | ICD-10-CM

## 2013-10-26 DIAGNOSIS — F411 Generalized anxiety disorder: Secondary | ICD-10-CM

## 2013-10-26 LAB — BASIC METABOLIC PANEL
BUN: 23 mg/dL (ref 6–23)
CALCIUM: 9.1 mg/dL (ref 8.4–10.5)
CO2: 25 mEq/L (ref 19–32)
Chloride: 101 mEq/L (ref 96–112)
Creat: 0.98 mg/dL (ref 0.50–1.10)
Glucose, Bld: 93 mg/dL (ref 70–99)
Potassium: 4.7 mEq/L (ref 3.5–5.3)
Sodium: 135 mEq/L (ref 135–145)

## 2013-10-26 LAB — LIPID PANEL
CHOLESTEROL: 219 mg/dL — AB (ref 0–200)
HDL: 66 mg/dL (ref >39)
LDL Cholesterol: 139 mg/dL — ABNORMAL HIGH (ref 0–99)
TRIGLYCERIDES: 68 mg/dL (ref <150)
Total CHOL/HDL Ratio: 3.3 Ratio
VLDL: 14 mg/dL (ref 0–40)

## 2013-10-26 MED ORDER — ZOSTER VACCINE LIVE 19400 UNT/0.65ML ~~LOC~~ SOLR: 0.6500 mL | Freq: Once | SUBCUTANEOUS | Status: DC

## 2013-10-26 NOTE — Patient Instructions (Signed)
Raynaud's Syndrome Raynaud's Syndrome is a disorder of the blood vessels in your hands and feet. It occurs when small arteries of the arms/hands or legs/feet become sensitive to cold or emotional upset. This causes the arteries to constrict, or narrow, and reduces blood flow to the area. The color in the fingers or toes changes from white to bluish to red and this is not usually painful. There may be numbness and tingling. Sores on the skin (ulcers) can form. Symptoms are usually relieved by warming. HOME CARE INSTRUCTIONS   Avoid exposure to cold. Keep your whole body warm and dry. Dress in layers. Wear mittens or gloves when handling ice or frozen food and when outdoors. Use holders for glasses or cans containing cold drinks. If possible, stay indoors during cold weather.  Limit your use of caffeine. Switch to decaffeinated coffee, tea, and soda pop. Avoid chocolate.  Avoid smoking or being around cigarette smoke. Smoke will make symptoms worse.  Wear loose fitting socks and comfortable, roomy shoes.  Avoid vibrating tools and machinery.  If possible, avoid stressful and emotional situations. Exercise, meditation and yoga may help you cope with stress. Biofeedback may be useful.  Ask your caregiver about medicine (calcium channel blockers) that may control Raynaud's phenomena. SEEK MEDICAL CARE IF:   Your discomfort becomes worse, despite conservative treatment.  You develop sores on your fingers and toes that do not heal. Document Released: 07/23/2000 Document Revised: 10/18/2011 Document Reviewed: 07/30/2008 [employer] Patient Information 2014 [employer].  

## 2013-10-27 LAB — THYROID PANEL WITH TSH
FREE THYROXINE INDEX: 2.7 (ref 1.0–3.9)
T3 Uptake: 31 % (ref 22.5–37.0)
T4, Total: 8.8 ug/dL (ref 5.0–12.5)
TSH: 3.593 u[IU]/mL (ref 0.350–4.500)

## 2013-10-27 LAB — VITAMIN D 25 HYDROXY (VIT D DEFICIENCY, FRACTURES): Vit D, 25-Hydroxy: 13 ng/mL — ABNORMAL LOW (ref 30–89)

## 2013-10-29 ENCOUNTER — Other Ambulatory Visit: Payer: Self-pay | Admitting: Family Medicine

## 2013-10-29 ENCOUNTER — Encounter: Payer: Self-pay | Admitting: Family Medicine

## 2013-10-29 MED ORDER — ERGOCALCIFEROL 1.25 MG (50000 UT) PO CAPS: 50000.0000 [IU] | ORAL_CAPSULE | ORAL | Status: DC

## 2013-10-30 NOTE — Progress Notes (Signed)
S:  This 68 y.o. Cauc female is here for follow-up re: thyroid disease and anxiety disorder. The severe dermatitis she had has resolved. Pt has less anxiety w/ daily use of Clonazepam; she takes Venlafaxine XR 150 mg daily. Her appetite is good and she sleeps a little better. Lifestyle is sedentary and family member is encouraging more physical activity. She is compliant w/ all medications and reports adverse effects.  Pt has chronic lower ext edema w/ known venous stasis. She has been advised to elevate legs in past but is not following medical advise. Compression hose worn daily are effective. Distal lower ext (toes) are cyanotic from midday until bedtime. Pt has diagnosis of Raynaud's disease and this was reviewed with pt and family member; main concern is whether this disorder will result in pt losing digits.  HCM- Colonoscopy- pt had consultation w/ GI specialist who has advised against doing a colonoscopy because of risk w/ colon prep/ frequent trips to bathroom/ risk of falls, etc. Pt declines recommended vaccine.  Patient Active Problem List   Diagnosis Date Noted  . Dermatitis 07/20/2013  . Generalized anxiety disorder 02/16/2013  . Unspecified hypothyroidism 02/16/2013  . Raynaud's disease 02/16/2013  . Lower extremity venous stasis 02/16/2013    Prior to Admission medications   Medication Sig Start Date End Date Taking? Authorizing Provider  clonazePAM (KLONOPIN) 0.5 MG tablet Take 1 tablet (0.5 mg total) by mouth 2 (two) times daily. 08/24/13  Yes Maurice March, MD  levothyroxine (SYNTHROID, LEVOTHROID) 25 MCG tablet Take 1 tablet (25 mcg total) by mouth daily. 08/24/13  Yes Maurice March, MD  lisinopril (PRINIVIL,ZESTRIL) 20 MG tablet Take 1 tablet (20 mg total) by mouth daily. 08/24/13  Yes Maurice March, MD  PRESCRIPTION MEDICATION Prednisone 5 mg tapering dose x 1 week per Dr. Terri Piedra   Yes Historical Provider, MD  spironolactone (ALDACTONE) 25 MG tablet TAKE 1  TABLET EVERY MORNING FOR LEG EDEMA   Yes Maurice March, MD  triamcinolone cream (KENALOG) 0.1 % Apply 1 application topically 2 (two) times daily.   Yes Historical Provider, MD  venlafaxine XR (EFFEXOR XR) 150 MG 24 hr capsule Take 1 capsule (150 mg total) by mouth daily. 07/20/13  Yes Maurice March, MD  ergocalciferol (DRISDOL) 50000 UNITS capsule Take 1 capsule (50,000 Units total) by mouth once a week. 10/29/13 10/29/14  Maurice March, MD  zoster vaccine live, PF, (ZOSTAVAX) 78469 UNT/0.65ML injection Inject 19,400 Units into the skin once. 10/26/13   Maurice March, MD   PMHx, Surg Hx, Soc and Fam hx reviewed.    ROS: As per HPI.  O: Filed Vitals:   10/26/13 1045  BP: 122/80  Pulse: 83  Temp: 97.9 F (36.6 C)  Resp: 16   GEN: In NAD: WN,WD. HENT: [place]/AT; EOMI w/ conj/sclerae. EACs/ nares/oral mucosa clear and moist. COR: RRR. Trace pretibial edema. LUNGS: Unlabored resp. SKIN: W&D; intact w/o rash or jaundice. Mild erythema of lower extremities w/ tips of digits cool to touch and w/ cyanotic hue. Blanching w/ pressure. MS: MAEs; no muscle atrophy. NEURO: A&O x 3; CNs intact. Gait- ambulates w/ walker. PSYCH: Pleasant and calm demeanor w/ occasional smile; conversant but speech is hesitant and somewhat slowed. Attentive w/ good eye contact.  A/P: Unspecified hypothyroidism - Continue current  Medication dose pending labs. Plan: Lipid panel, Thyroid Panel With TSH  Lower extremity venous stasis - Elevate legs for 10-15 minutes twice a day. Continue compression hose.  Plan: Vit  D  25 hydroxy (rtn osteoporosis monitoring), Basic metabolic panel, Thyroid Panel With TSH  Generalized anxiety disorder - Plan: Vit D  25 hydroxy (rtn osteoporosis monitoring), Thyroid Panel With TSH  Raynaud's disease- Symptomatic measures to insure digits stay warm.

## 2013-11-28 ENCOUNTER — Other Ambulatory Visit: Payer: Self-pay | Admitting: Family Medicine

## 2013-11-29 NOTE — Telephone Encounter (Signed)
Clonazepam refill phoned to pt's pharmacy. 

## 2014-01-13 ENCOUNTER — Other Ambulatory Visit: Payer: Self-pay | Admitting: Family Medicine

## 2014-01-20 ENCOUNTER — Other Ambulatory Visit: Payer: Self-pay | Admitting: Family Medicine

## 2014-02-20 ENCOUNTER — Other Ambulatory Visit: Payer: Self-pay | Admitting: Family Medicine

## 2014-03-07 ENCOUNTER — Other Ambulatory Visit: Payer: Self-pay | Admitting: Family Medicine

## 2014-03-08 NOTE — Telephone Encounter (Signed)
Pt called in for refill on clonazePAM (KLONOPIN) 0.5 MG tablet

## 2014-03-09 NOTE — Telephone Encounter (Signed)
Clonazepam refill phoned to pt's pharmacy. 

## 2014-03-09 NOTE — Telephone Encounter (Signed)
PT IS CHECKING ON STATUS OF REFILL REQUEST SHE STATES SHE IS COMPLETELY OUT AND NEEDS THIS THIS WEEKEND

## 2014-03-24 ENCOUNTER — Other Ambulatory Visit: Payer: Self-pay | Admitting: Family Medicine

## 2014-03-25 NOTE — Telephone Encounter (Signed)
Patient requesting a refill on her "Lisinopril'. Per patient she will take her last one this Thursday. [facility] [address] Patients call back number is [phone]

## 2014-04-04 ENCOUNTER — Encounter: Payer: Self-pay | Admitting: Family Medicine

## 2014-04-04 ENCOUNTER — Ambulatory Visit (INDEPENDENT_AMBULATORY_CARE_PROVIDER_SITE_OTHER): Payer: Medicare HMO | Admitting: Family Medicine

## 2014-04-04 ENCOUNTER — Encounter: Payer: Medicare HMO | Admitting: Family Medicine

## 2014-04-04 VITALS — BP 132/84 | HR 95 | Temp 97.8°F | Resp 16 | Ht 66.5 in | Wt 174.0 lb

## 2014-04-04 DIAGNOSIS — Z1211 Encounter for screening for malignant neoplasm of colon: Secondary | ICD-10-CM

## 2014-04-04 DIAGNOSIS — F411 Generalized anxiety disorder: Secondary | ICD-10-CM

## 2014-04-04 DIAGNOSIS — E039 Hypothyroidism, unspecified: Secondary | ICD-10-CM

## 2014-04-04 DIAGNOSIS — Z0000 Encounter for general adult medical examination without abnormal findings: Secondary | ICD-10-CM

## 2014-04-04 DIAGNOSIS — I10 Essential (primary) hypertension: Secondary | ICD-10-CM

## 2014-04-04 DIAGNOSIS — K5900 Constipation, unspecified: Secondary | ICD-10-CM | POA: Insufficient documentation

## 2014-04-04 DIAGNOSIS — E559 Vitamin D deficiency, unspecified: Secondary | ICD-10-CM

## 2014-04-04 LAB — POCT URINALYSIS DIPSTICK
Bilirubin, UA: NEGATIVE
Glucose, UA: NEGATIVE
Ketones, UA: NEGATIVE
Leukocytes, UA: NEGATIVE
NITRITE UA: NEGATIVE
PROTEIN UA: NEGATIVE
SPEC GRAV UA: 1.01
UROBILINOGEN UA: 0.2
pH, UA: 7.5

## 2014-04-04 MED ORDER — VENLAFAXINE HCL ER 150 MG PO CP24: ORAL_CAPSULE | ORAL | Status: DC

## 2014-04-04 MED ORDER — SPIRONOLACTONE 25 MG PO TABS: ORAL_TABLET | ORAL | Status: DC

## 2014-04-04 MED ORDER — LISINOPRIL 20 MG PO TABS: ORAL_TABLET | ORAL | Status: DC

## 2014-04-04 MED ORDER — ERGOCALCIFEROL 1.25 MG (50000 UT) PO CAPS: 50000.0000 [IU] | ORAL_CAPSULE | ORAL | Status: DC

## 2014-04-04 NOTE — Progress Notes (Signed)
Subjective:    Patient ID: Christy Luna, female    DOB: 1946-05-25, 68 y.o.   MRN: [id number]  HPI This 68 y.o. Cauc female is here for [facility] CPE- Initial visit. She has HTN, hypothyroidism, chronic anxiety/ depression and Raynaud's disease. Pt is compliant with all medications, according to her sister-in-law who accompanies pt to all visits here. Pt ran out of Clonazepam and had severe exacerbation of anxiety as a result. Pt declines to get preventative services.  HCM: MMG- Declines.           CRS- GI specialist has evaluated pt butdeclines to perform procedure due to pt's state                        of mind and chronic conditions.            Vision- Needs vision assessment.            Dental- Needs assessment.            IMM- Pt declines.   Patient Active Problem List   Diagnosis Date Noted  . Unspecified constipation 04/04/2014  . Unspecified essential hypertension 04/04/2014  . Dermatitis 07/20/2013  . Generalized anxiety disorder 02/16/2013  . Unspecified hypothyroidism 02/16/2013  . Raynaud's disease 02/16/2013  . Lower extremity venous stasis 02/16/2013    Prior to Admission medications   Medication Sig Start Date End Date Taking? Authorizing Provider  clonazePAM (KLONOPIN) 0.5 MG tablet TAKE 1 TABLET BY MOUTH TWICE A DAY   Yes Maurice March, MD  ergocalciferol (DRISDOL) 50000 UNITS capsule Take 1 capsule (50,000 Units total) by mouth once a week.             Yes Maurice March, MD  levothyroxine (SYNTHROID, LEVOTHROID) 25 MCG tablet TAKE 1 TABLET (25 MCG TOTAL) BY MOUTH DAILY. 02/20/14  Yes Chelle S Jeffery, PA-C  lisinopril (PRINIVIL,ZESTRIL) 20 MG tablet TAKE 1 TABLET (20 MG TOTAL) BY MOUTH DAILY.   Yes Maurice March, MD  PRESCRIPTION MEDICATION Prednisone 5 mg tapering dose x 1 week per Dr. Terri Piedra   Yes Historical Provider, MD  spironolactone (ALDACTONE) 25 MG tablet TAKE 1 TABLET EVERY MORNING FOR LEG EDEMA   Yes Maurice March, MD  triamcinolone  cream (KENALOG) 0.1 % Apply 1 application topically 2 (two) times daily.   Yes Historical Provider, MD  venlafaxine XR (EFFEXOR-XR) 150 MG 24 hr capsule TAKE 1 CAPSULE (150 MG TOTAL) BY MOUTH DAILY.   Yes Maurice March, MD  zoster vaccine live, PF, (ZOSTAVAX) 89381 UNT/0.65ML injection Inject 19,400 Units into the skin once. 10/26/13   Maurice March, MD   History   Social History  . Marital Status: Single    Spouse Name: N/A    Number of Children: N/A  . Years of Education: N/A   Occupational History  . Not on file.   Social History Main Topics  . Smoking status: Never Smoker   . Smokeless tobacco: Not on file  . Alcohol Use: No  . Drug Use: No  . Sexual Activity: Not on file   Other Topics Concern  . Not on file   Social History Narrative  . Pt does not work nor have any activities outside the home (per sister-in-law).    Family History  Problem Relation Age of Onset  . Heart disease Father   . Hypertension Father   . Hyperlipidemia Father   . Heart disease Brother   .  Arthritis Brother   . Hyperlipidemia Brother   . Hypertension Brother   . Tuberculosis Maternal Grandmother   . Heart disease Maternal Grandfather   . Heart disease Paternal Grandmother   . Cancer Paternal Grandfather   . Mental illness Mother   . Hypertension Mother     Review of Systems  Constitutional:       All symptoms are chronic; no new or acute problems.  HENT: Positive for sneezing.   Eyes: Positive for visual disturbance.  Respiratory: Negative.   Cardiovascular: Positive for leg swelling.  Gastrointestinal: Positive for nausea, abdominal pain and constipation.  Endocrine: Positive for polyphagia and polyuria.  Genitourinary: Positive for enuresis and difficulty urinating.  Musculoskeletal: Positive for arthralgias, back pain and gait problem.  Skin: Negative.   Allergic/Immunologic: Negative.   Neurological: Positive for dizziness, weakness, light-headedness and  numbness.  Hematological: Negative.   Psychiatric/Behavioral: Positive for decreased concentration and agitation.      Objective:   Physical Exam  Nursing note and vitals reviewed. Constitutional: She is oriented to person, place, and time. Vital signs are normal. She appears well-developed and well-nourished. She is cooperative. No distress.  HENT:  Head: Normocephalic and atraumatic.  Right Ear: Hearing, tympanic membrane, external ear and ear canal normal.  Left Ear: Hearing, tympanic membrane, external ear and ear canal normal.  Nose: Nose normal. No nasal deformity or septal deviation.  Mouth/Throat: Uvula is midline, oropharynx is clear and moist and mucous membranes are normal. No oral lesions. Normal dentition. No dental caries.  Eyes: Conjunctivae, EOM and lids are normal. Pupils are equal, round, and reactive to light. No scleral icterus.  Neck: Trachea normal, normal range of motion, full passive range of motion without pain and phonation normal. Neck supple. No JVD present. No spinous process tenderness and no muscular tenderness present. Carotid bruit is not present. No mass and no thyromegaly present.  Cardiovascular: Regular rhythm, S1 normal, S2 normal and normal heart sounds.   No extrasystoles are present. Bradycardia present.  PMI is not displaced.  Exam reveals no gallop and no friction rub.   No murmur heard. Pulses:      Radial pulses are 1+ on the right side, and 1+ on the left side.       Femoral pulses are 1+ on the right side, and 1+ on the left side.      Popliteal pulses are 1+ on the right side, and 1+ on the left side.       Dorsalis pedis pulses are 1+ on the right side, and 1+ on the left side.       Posterior tibial pulses are 1+ on the right side, and 1+ on the left side.  Pulmonary/Chest: Effort normal and breath sounds normal. No respiratory distress. She has no decreased breath sounds. She has no wheezes. She has no rales. Right breast exhibits no  inverted nipple, no mass, no nipple discharge, no skin change and no tenderness. Left breast exhibits no inverted nipple, no mass, no nipple discharge, no skin change and no tenderness. Breasts are symmetrical.  Dense breast tissue.  Abdominal: Soft. Normal appearance and normal aorta. She exhibits no distension, no fluid wave, no ascites, no pulsatile midline mass and no mass. Bowel sounds are decreased. There is no hepatosplenomegaly. There is no tenderness. There is no guarding and no CVA tenderness.  Genitourinary:  Deferred.  Musculoskeletal:       Right shoulder: Normal.       Left shoulder: Normal.  Cervical back: Normal.       Thoracic back: Normal.       Lumbar back: She exhibits tenderness and spasm. She exhibits no deformity and no pain.  Mild degenerative changes w/ decreased ROM in all major joints. Feet- hammer toes.  Lymphadenopathy:       Head (right side): No submental, no submandibular, no tonsillar, no preauricular, no posterior auricular and no occipital adenopathy present.       Head (left side): No submental, no submandibular, no tonsillar, no preauricular, no posterior auricular and no occipital adenopathy present.    She has no cervical adenopathy.    She has no axillary adenopathy.       Right: No inguinal and no supraclavicular adenopathy present.       Left: No inguinal and no supraclavicular adenopathy present.  Neurological: She is alert and oriented to person, place, and time. She has normal strength. She displays no atrophy and no tremor. No cranial nerve deficit or sensory deficit. She exhibits normal muscle tone. Gait abnormal. Coordination normal.  Ambulates w/ a walker; gait is stiff and slightly stooped. DTRs not tested.  Skin: Skin is warm, dry and intact. Rash noted. No ecchymosis noted. She is not diaphoretic. There is erythema. There is pallor. Nails show no clubbing.  Lower ext with cyanotic discoloration (increased w/ sitting position; decreased  with limbs elevated). Lower ext erythema and mild dependent edema/ venous stasis. Chronically dry skin with tenting. Flakiness and mild erythema.  Psychiatric: Judgment and thought content normal. Her mood appears not anxious. Her affect is blunt. Her affect is not labile and not inappropriate. Her speech is delayed. Her speech is not rapid and/or pressured, not tangential and not slurred. She is slowed. She is not agitated and not withdrawn. Cognition and memory are impaired. She exhibits a depressed mood.  Slightly impaired cognition. She is attentive.    Results for orders placed in visit on 04/04/14  POCT URINALYSIS DIPSTICK      Result Value Ref Range   Color, UA yellow     Clarity, UA clear     Glucose, UA neg     Bilirubin, UA neg     Ketones, UA neg     Spec Grav, UA 1.010     Blood, UA trace-inta     pH, UA 7.5     Protein, UA neg     Urobilinogen, UA 0.2     Nitrite, UA neg     Leukocytes, UA Negative        Assessment & Plan:  Routine general medical examination at a health care facility  Unspecified constipation- Related to inactivity, thyroid disorder, lack or adequate hydration and fiber intake.  Unspecified hypothyroidism - Continue current medication dose pending labs.  Plan: Thyroid Panel With TSH, Vit D  25 hydroxy (rtn osteoporosis monitoring)  Generalized anxiety disorder- Continue Clonazepam prn and daily Venlafaxine.  Unspecified essential hypertension - Stable and controlled on current medications. Plan: COMPLETE METABOLIC PANEL WITH GFR, POCT urinalysis dipstick  Unspecified vitamin D deficiency  Screening for colon cancer - Unlikely pt will have CRS procedure. Plan: IFOBT POC (occult bld, rslt in office), IFOBT POC (occult bld, rslt in office), IFOBT POC (occult bld, rslt in office)  Meds ordered this encounter  Medications  . lisinopril (PRINIVIL,ZESTRIL) 20 MG tablet    Sig: TAKE 1 TABLET (20 MG TOTAL) BY MOUTH DAILY.    Dispense:  30 tablet     Refill:  11  .  spironolactone (ALDACTONE) 25 MG tablet    Sig: TAKE 1 TABLET EVERY MORNING FOR LEG EDEMA    Dispense:  30 tablet    Refill:  5  . venlafaxine XR (EFFEXOR-XR) 150 MG 24 hr capsule    Sig: TAKE 1 CAPSULE (150 MG TOTAL) BY MOUTH DAILY.    Dispense:  30 capsule    Refill:  11  . ergocalciferol (DRISDOL) 50000 UNITS capsule    Sig: Take 1 capsule (50,000 Units total) by mouth once a week.    Dispense:  4 capsule    Refill:  3

## 2014-04-04 NOTE — Patient Instructions (Addendum)
Keeping You Healthy  Get These Tests  Blood Pressure- Have your blood pressure checked by your healthcare provider at least once a year.  Normal blood pressure is 120/80.  Weight- Have your body mass index (BMI) calculated to screen for obesity.  BMI is a measure of body fat based on height and weight.  You can calculate your own BMI at https://www.west-esparza.com/  Cholesterol- Have your cholesterol checked every year.  Diabetes- Have your blood sugar checked every year if you have high blood pressure, high cholesterol, a family history of diabetes or if you are overweight.  Pap Smear- Have a pap smear every 1 to 3 years if you have been sexually active.  If you are older than 65 and recent pap smears have been normal you may not need additional pap smears.  In addition, if you have had a hysterectomy  For benign disease additional pap smears are not necessary.  Mammogram-Yearly mammograms are essential for early detection of breast cancer  Screening for Colon Cancer- Colonoscopy starting at age 8. Screening may begin sooner depending on your family history and other health conditions.  Follow up colonoscopy as directed by your Gastroenterologist.  Screening for Osteoporosis- Screening begins at age 7 with bone density scanning, sooner if you are at higher risk for developing Osteoporosis.  Get these medicines  Calcium with Vitamin D- Your body requires 1200-1500 mg of Calcium a day and [phone] IU of Vitamin D a day.  You can only absorb 500 mg of Calcium at a time therefore Calcium must be taken in 2 or 3 separate doses throughout the day.  Hormones- Hormone therapy has been associated with increased risk for certain cancers and heart disease.  Talk to your healthcare provider about if you need relief from menopausal symptoms.  Aspirin- Ask your healthcare provider about taking Aspirin to prevent Heart Disease and Stroke.  Get these Immuniztions  Flu shot- Every fall  Pneumonia  shot- Once after the age of 19; if you are younger ask your healthcare provider if you need a pneumonia shot.  Tetanus- Every ten years.  Zostavax- Once after the age of 42 to prevent shingles.  Take these steps  Don't smoke- Your healthcare provider can help you quit. For tips on how to quit, ask your healthcare provider or go to www.smokefree.gov or call 1-800 QUIT-NOW.  Be physically active- Exercise 5 days a week for a minimum of 30 minutes.  If you are not already physically active, start slow and gradually work up to 30 minutes of moderate physical activity.  Try walking, dancing, bike riding, swimming, etc.  Eat a healthy diet- Eat a variety of healthy foods such as fruits, vegetables, whole grains, low fat milk, low fat cheeses, yogurt, lean meats, chicken, fish, eggs, dried beans, tofu, etc.  For more information go to www.thenutritionsource.org  Dental visit- Brush and floss teeth twice daily; visit your dentist twice a year.  Eye exam- Visit your Optometrist or Ophthalmologist yearly.  Drink alcohol in moderation- Limit alcohol intake to one drink or less a day.  Never drink and drive.  Depression- Your emotional health is as important as your physical health.  If you're feeling down or losing interest in things you normally enjoy, please talk to your healthcare provider.  Seat Belts- can save your life; always wear one  Smoke/Carbon Monoxide detectors- These detectors need to be installed on the appropriate level of your home.  Replace batteries at least once a year.  Violence- If anyone  is threatening or hurting you, please tell your healthcare provider.  Living Will/ Health care power of attorney- Discuss with your healthcare provider and family.    I have recommended you schedule: - eye care appointment - mammogram - podiatry appointment to have your feet checked (you have a lesion on your left big toe).    Constipation Constipation is when a person:  Poops  (has a bowel movement) less than 3 times a week.  Has a hard time pooping.  Has poop that is dry, hard, or bigger than normal. HOME CARE   Eat foods with a lot of fiber in them. This includes fruits, vegetables, beans, and whole grains such as brown rice.  Avoid fatty foods and foods with a lot of sugar. This includes french fries, hamburgers, cookies, candy, and soda.  If you are not getting enough fiber from food, take products with added fiber in them (supplements).  Drink enough fluid to keep your pee (urine) clear or pale yellow.  Exercise on a regular basis, or as told by your doctor.  Go to the restroom when you feel like you need to poop. Do not hold it.  Only take medicine as told by your doctor. Do not take medicines that help you poop (laxatives) without talking to your doctor first. GET HELP RIGHT AWAY IF:   You have bright red blood in your poop (stool).  Your constipation lasts more than 4 days or gets worse.  You have belly (abdominal) or butt (rectal) pain.  You have thin poop (as thin as a pencil).  You lose weight, and it cannot be explained. MAKE SURE YOU:   Understand these instructions.  Will watch your condition.  Will get help right away if you are not doing well or get worse. Document Released: 01/12/2008 Document Revised: 07/31/2013 Document Reviewed: 05/07/2013 [facility] Patient Information 2015 [place], [place]. This information is not intended to replace advice given to you by your health care provider. Make sure you discuss any questions you have with your health care provider.

## 2014-04-05 ENCOUNTER — Telehealth: Payer: Self-pay

## 2014-04-05 LAB — COMPLETE METABOLIC PANEL WITH GFR
ALBUMIN: 4.1 g/dL (ref 3.5–5.2)
ALK PHOS: 65 U/L (ref 39–117)
ALT: 11 U/L (ref 0–35)
AST: 18 U/L (ref 0–37)
BILIRUBIN TOTAL: 0.4 mg/dL (ref 0.2–1.2)
BUN: 18 mg/dL (ref 6–23)
CO2: 29 meq/L (ref 19–32)
Calcium: 9.1 mg/dL (ref 8.4–10.5)
Chloride: 103 mEq/L (ref 96–112)
Creat: 1.1 mg/dL (ref 0.50–1.10)
GFR, EST AFRICAN AMERICAN: 60 mL/min
GFR, EST NON AFRICAN AMERICAN: 52 mL/min — AB
GLUCOSE: 85 mg/dL (ref 70–99)
Potassium: 4.8 mEq/L (ref 3.5–5.3)
Sodium: 138 mEq/L (ref 135–145)
Total Protein: 6.8 g/dL (ref 6.0–8.3)

## 2014-04-05 LAB — VITAMIN D 25 HYDROXY (VIT D DEFICIENCY, FRACTURES): Vit D, 25-Hydroxy: 31 ng/mL (ref 30–89)

## 2014-04-05 LAB — THYROID PANEL WITH TSH
Free Thyroxine Index: 2 (ref 1.4–3.8)
T3 UPTAKE: 27 % (ref 22.0–35.0)
T4 TOTAL: 7.3 ug/dL (ref 4.5–12.0)
TSH: 5.607 u[IU]/mL — ABNORMAL HIGH (ref 0.350–4.500)

## 2014-04-05 NOTE — Telephone Encounter (Signed)
Spoke to pt- she understands the directions for the fecal samples.

## 2014-04-05 NOTE — Telephone Encounter (Signed)
Pt was given three packets to submit fecal samples.  She wants to know if she is supposed to use all three or not?  [phone]

## 2014-04-19 ENCOUNTER — Telehealth: Payer: Self-pay

## 2014-04-19 NOTE — Telephone Encounter (Signed)
Pt is not connected with MYCHART and would like someone to verbally review her lab results from 8/28.

## 2014-04-20 NOTE — Telephone Encounter (Signed)
Unable to leave message on machine for patient.Christy Luna

## 2014-04-22 ENCOUNTER — Other Ambulatory Visit: Payer: Self-pay | Admitting: Family Medicine

## 2014-04-23 ENCOUNTER — Encounter: Payer: Self-pay | Admitting: Podiatry

## 2014-04-23 ENCOUNTER — Ambulatory Visit (INDEPENDENT_AMBULATORY_CARE_PROVIDER_SITE_OTHER): Payer: Medicare HMO | Admitting: Podiatry

## 2014-04-23 ENCOUNTER — Other Ambulatory Visit: Payer: Self-pay | Admitting: Podiatry

## 2014-04-23 ENCOUNTER — Ambulatory Visit (INDEPENDENT_AMBULATORY_CARE_PROVIDER_SITE_OTHER): Payer: Medicare HMO

## 2014-04-23 VITALS — BP 108/64 | HR 83 | Resp 16 | Ht 66.5 in | Wt 174.0 lb

## 2014-04-23 DIAGNOSIS — B351 Tinea unguium: Secondary | ICD-10-CM

## 2014-04-23 DIAGNOSIS — M2040 Other hammer toe(s) (acquired), unspecified foot: Secondary | ICD-10-CM

## 2014-04-23 DIAGNOSIS — Q828 Other specified congenital malformations of skin: Secondary | ICD-10-CM

## 2014-04-23 DIAGNOSIS — M79672 Pain in left foot: Secondary | ICD-10-CM

## 2014-04-23 DIAGNOSIS — M2042 Other hammer toe(s) (acquired), left foot: Secondary | ICD-10-CM

## 2014-04-23 DIAGNOSIS — M79671 Pain in right foot: Secondary | ICD-10-CM

## 2014-04-23 DIAGNOSIS — M069 Rheumatoid arthritis, unspecified: Secondary | ICD-10-CM

## 2014-04-23 DIAGNOSIS — M2041 Other hammer toe(s) (acquired), right foot: Secondary | ICD-10-CM

## 2014-04-23 DIAGNOSIS — M79609 Pain in unspecified limb: Secondary | ICD-10-CM

## 2014-04-23 DIAGNOSIS — L84 Corns and callosities: Secondary | ICD-10-CM

## 2014-04-23 NOTE — Telephone Encounter (Signed)
Notes Recorded by Maurice March, MD on 04/07/2014 at 6:02 PM Your lab results are available for your review. All medications are the same for now. Be sure to take Thyroid medication every morning on an empty stomach. Thyroid function tests will need to be rechecked in 3 months (late November or early December) to make sure you are taking adequate amount of medication.

## 2014-04-23 NOTE — Progress Notes (Signed)
   Subjective:    Patient ID: Samaa Ueda, female    DOB: November 27, 1945, 68 y.o.   MRN: [id number]  HPI Comments: "I have these corns on my foot"  Patient c/o aching, callused areas plantar 1st toe and 5th MPJ left foot for several years. Her PCP was concerned and wanted them checked. Very painful to walk.      Review of Systems  Constitutional: Positive for fatigue.  Eyes: Positive for itching and visual disturbance.  Cardiovascular: Positive for leg swelling.  Gastrointestinal: Positive for constipation and abdominal distention.  Musculoskeletal: Positive for back pain and gait problem.  Skin: Positive for rash and wound.  Neurological: Positive for dizziness, weakness, light-headedness and headaches.  Hematological: Bruises/bleeds easily.  Psychiatric/Behavioral: Positive for behavioral problems. The patient is nervous/anxious.   All other systems reviewed and are negative.      Objective:   Physical Exam: I have reviewed her past medical history medications allergies surgeries social history and review of systems. Pulses are strongly palpable bilateral. Neurologic sensorium is slightly decreased per since once the monofilament. Deep tendon reflexes are intact bilateral muscle strength is 4/5 dorsiflexors plantar flexors inverters everters all his musculature is intact. Orthopedic evaluation demonstrates hammertoe deformities 2 through 5 bilateral. Otherwise all joints distal to the ankle a full range of motion without crepitation. She has pain on palpation of the lesser metatarsophalangeal joints and painful range of motion of her toes. Radiographic evaluation demonstrates severe osteopenia multiple healed fractures to the metatarsal necks. Lateral deviation of toes. Cutaneous evaluation demonstrates supple well hydrated cutis dorsally however the plantar aspect of the bilateral foot does demonstrate dry xerotic skin associated with Raynaud's disease and porokeratotic lesions. Her nails  are thick yellow dystrophic onychomycotic. They're also painful palpation.        Assessment & Plan:  Assessment: Raynaud's disease. Hammertoe deformities. Unstable gait. Porokeratosis bilateral. Pain in limb secondary to onychomycosis 1 through 5 bilateral. Care rule out rheumatoid arthritis.  Plan: Discussed etiology pathology conservative versus surgical therapies. We sent her to physical therapy for gait training and strength training. She is a fall risk. Also we suggested blood work to be performed consisting of a arthritic profile and CBC. I debrided all reactive hyperkeratosis and debridement nails 1 through 5 bilateral today discussed appropriate shoe gear stretching exercises ice therapy. Followup with her in the near future.

## 2014-04-23 NOTE — Patient Instructions (Signed)
Hammer Toes Hammer toes is a condition in which one or more of your toes is permanently flexed. CAUSES  This happens when a muscle imbalance or abnormal bone length makes your small toes buckle. This causes the toe joint to contract and the strong cord-like bands that attach muscles to the bones (tendons) in your toes to shorten.  SIGNS AND SYMPTOMS  Common symptoms of flexible hammer toes include:   A buildup of skin cells (corns). Corns occur where boney bumps come in frequent contact with hard surfaces. For example, where your shoes press and rub.  Irritation.  Inflammation.  Pain.  Limited motion in your toes. DIAGNOSIS  Hammer toes are diagnosed through a physical exam of your toes. During the exam, your health care provider may try to reproduce your symptoms by manipulating your foot. Often, X-ray exams are done to determine the degree of deformity and to make sure that the cause is not a fracture.  TREATMENT  Hammer toes can be treated with corrective surgery. There are several types of surgical procedures that can treat hammer toes. The most common procedures include:  Arthroplasty--A portion of the joint is surgically removed and your toe is straightened. The gap fills in with fibrous tissue. This procedure helps treat pain and deformity and helps restore function.  Fusion--Cartilage between the two bones of the affected joint is taken out and the bones fuse together into one longer bone. This helps keep your toe stable and reduces pain but leaves your toe stiff, yet straight.  Implantation--A portion of your bone is removed and replaced with an implant to restore motion.  Flexor tendon transfers--This procedure repositions the tendons that curl the toes down (flexor tendons). This may be done to release the deforming force that causes your toe to buckle. Several of these procedures require fixing your toe with a pin that is visible at the tip of your toe. The pin keeps the toe  straight during healing. Your health care provider will remove the pin usually within 4-8 weeks after the procedure.  Document Released: 07/23/2000 Document Revised: 07/31/2013 Document Reviewed: 04/02/2013 [employer] Patient Information 2015 [employer]. This information is not intended to replace advice given to you by your health care provider. Make sure you discuss any questions you have with your health care provider.  

## 2014-04-24 LAB — CBC WITH DIFFERENTIAL/PLATELET
Basophils Absolute: 0.1 10*3/uL (ref 0.0–0.1)
Basophils Relative: 1 % (ref 0–1)
Eosinophils Absolute: 0.1 10*3/uL (ref 0.0–0.7)
Eosinophils Relative: 1 % (ref 0–5)
HCT: 38 % (ref 36.0–46.0)
HEMOGLOBIN: 12.5 g/dL (ref 12.0–15.0)
LYMPHS ABS: 1.5 10*3/uL (ref 0.7–4.0)
Lymphocytes Relative: 18 % (ref 12–46)
MCH: 27.7 pg (ref 26.0–34.0)
MCHC: 32.9 g/dL (ref 30.0–36.0)
MCV: 84.3 fL (ref 78.0–100.0)
Monocytes Absolute: 0.7 10*3/uL (ref 0.1–1.0)
Monocytes Relative: 9 % (ref 3–12)
NEUTROS ABS: 5.9 10*3/uL (ref 1.7–7.7)
Neutrophils Relative %: 71 % (ref 43–77)
Platelets: 292 10*3/uL (ref 150–400)
RBC: 4.51 MIL/uL (ref 3.87–5.11)
RDW: 14.5 % (ref 11.5–15.5)
WBC: 8.3 10*3/uL (ref 4.0–10.5)

## 2014-04-24 LAB — URIC ACID: URIC ACID, SERUM: 6.7 mg/dL (ref 2.4–7.0)

## 2014-04-24 LAB — ANTI-NUCLEAR AB-TITER (ANA TITER): ANA Titer 1: 1:80 {titer} — ABNORMAL HIGH

## 2014-04-24 LAB — ANA: Anti Nuclear Antibody(ANA): POSITIVE — AB

## 2014-04-24 LAB — RHEUMATOID FACTOR: Rhuematoid fact SerPl-aCnc: <10 IU/mL (ref <=14)

## 2014-04-24 LAB — C-REACTIVE PROTEIN: CRP: <0.5 mg/dL (ref <0.60)

## 2014-04-24 LAB — SEDIMENTATION RATE: Sed Rate: 15 mm/hr (ref 0–22)

## 2014-04-24 NOTE — Telephone Encounter (Signed)
Called again advised, patient understands

## 2014-04-29 ENCOUNTER — Telehealth: Payer: Self-pay

## 2014-04-29 DIAGNOSIS — M159 Polyosteoarthritis, unspecified: Secondary | ICD-10-CM

## 2014-04-29 DIAGNOSIS — R269 Unspecified abnormalities of gait and mobility: Secondary | ICD-10-CM

## 2014-04-29 DIAGNOSIS — Z9181 History of falling: Secondary | ICD-10-CM

## 2014-04-29 DIAGNOSIS — M150 Primary generalized (osteo)arthritis: Secondary | ICD-10-CM

## 2014-04-29 DIAGNOSIS — M8949 Other hypertrophic osteoarthropathy, multiple sites: Secondary | ICD-10-CM

## 2014-04-29 DIAGNOSIS — M19079 Primary osteoarthritis, unspecified ankle and foot: Secondary | ICD-10-CM

## 2014-04-29 NOTE — Telephone Encounter (Signed)
Pt was referred out to PT by Dr. Audria Nine, [facility] Dr. Princess Bruins that Sherrie have PT for balance 3x/wk for 4 wks. Bonita Quin would like to know if Dr. Audria Nine could put an order in so that the pt could have at home PT? Please call Bonita Quin at [phone] or [phone]

## 2014-04-29 NOTE — Telephone Encounter (Signed)
Pt was referred out to PT by Dr. McPherson, [facility] Dr. Suggested that Christy Luna have PT for balance 3x/wk for 4 wks. Linda would like to know if Dr. McPherson could put an order in so that the pt could have at home PT? Please call Linda at [phone] or [phone] ° °

## 2014-04-30 NOTE — Telephone Encounter (Signed)
I spoke w/ Christy Luna who requests pt be referred for possible home PT ([facility]). Pt needs assessment and balance/gait exercises 3x/week for 4 weeks if approved.

## 2014-05-02 ENCOUNTER — Telehealth: Payer: Self-pay | Admitting: *Deleted

## 2014-05-02 DIAGNOSIS — R768 Other specified abnormal immunological findings in serum: Secondary | ICD-10-CM

## 2014-05-02 NOTE — Telephone Encounter (Signed)
I faxed over referral to Dr. Azzie Roup along with chart notes and demographics requesting patient be scheduled for a rheumatology consult.  I called and informed the patient that Dr. Al Corpus got the lab results back and he wants to refer you to a Rheumatologist.  His name is Dr. Azzie Roup.  They should give you a call to get an appointment scheduled.  She stated, "Okay, thank you."

## 2014-05-02 NOTE — Telephone Encounter (Signed)
Message copied by Enedina Finner on Thu May 02, 2014 11:31 AM ------      Message from: Lottie Rater E      Created: Thu Apr 25, 2014  8:23 AM                   ----- Message -----         From: Elinor Parkinson, DPM         Sent: 04/24/2014   3:43 PM           To: [employer], PMAC            I would like to refer her to rheumatology for the positive ANA.  Thanks ------

## 2014-05-08 LAB — IFOBT (OCCULT BLOOD): IFOBT: NEGATIVE

## 2014-05-20 ENCOUNTER — Telehealth: Payer: Self-pay

## 2014-05-20 NOTE — Telephone Encounter (Signed)
Pt sister in law is calling about physical therapy and having [facility] coming out   Please call [phone]

## 2014-05-22 ENCOUNTER — Other Ambulatory Visit: Payer: Self-pay

## 2014-05-22 DIAGNOSIS — R269 Unspecified abnormalities of gait and mobility: Secondary | ICD-10-CM

## 2014-05-22 NOTE — Telephone Encounter (Signed)
Patient is calling about physical therapy and home health referral. There are prior messages put in regarding this however there is no referral. Please advise

## 2014-05-22 NOTE — Telephone Encounter (Signed)
I entered a referral for home health care to have gait evaluated.

## 2014-05-22 NOTE — Telephone Encounter (Signed)
There is a PT order dated 04/30/2014; it is located under "Other Orders" under Chart Review. I specified [facility] for home assessment for PT. If there is a problem w/ this order or it has to be re-done, let me know.  Thanks.

## 2014-06-04 ENCOUNTER — Other Ambulatory Visit: Payer: Self-pay | Admitting: Family Medicine

## 2014-06-04 ENCOUNTER — Telehealth: Payer: Self-pay | Admitting: *Deleted

## 2014-06-04 NOTE — Telephone Encounter (Signed)
Clonazepam refills phoned to pt's pharmacy.

## 2014-06-04 NOTE — Telephone Encounter (Signed)
Ranae Palms from [facility] called to notify us that pt had a fall over the weekend. She did not sustain any injuries. PT has initiated OT to help with gait, stability and balance.

## 2014-06-25 ENCOUNTER — Ambulatory Visit (INDEPENDENT_AMBULATORY_CARE_PROVIDER_SITE_OTHER): Payer: Medicare HMO | Admitting: Podiatry

## 2014-06-25 DIAGNOSIS — M79671 Pain in right foot: Secondary | ICD-10-CM

## 2014-06-25 DIAGNOSIS — Q828 Other specified congenital malformations of skin: Secondary | ICD-10-CM

## 2014-06-25 DIAGNOSIS — M79672 Pain in left foot: Secondary | ICD-10-CM

## 2014-06-25 DIAGNOSIS — B351 Tinea unguium: Secondary | ICD-10-CM

## 2014-06-25 NOTE — Progress Notes (Signed)
She presents today after physical therapy has completed. She is starting occupational therapy tomorrow. She states that she feels that she is much more stable and stronger that she was on her previous visit. The last time she was in we drew a liver profile as well as an arthritic panel which did demonstrate a positive AMA. She was referred to rheumatology and since released. She's complaining of painful elongated toenails as well as lesions to the plantar aspect of the bilateral foot.  Objective: Pulses are palpable bilateral. Porokeratotic lesion sub-fifth and sub-first metatarsophalangeal joints bilateral. She also has a lesion sub-IP joint of the hallux left. Her nails are thick yellow dystrophic clinic for mycotic and painful on palpation.  Assessment: Pain in limb secondary to porokeratosis and onychomycosis bilateral.  Plan: Debridement of all mycotic tissue and all porokeratotic tissue is covered service secondary to pain.

## 2014-07-18 ENCOUNTER — Telehealth: Payer: Self-pay

## 2014-07-18 NOTE — Telephone Encounter (Signed)
Spoke to patient regarding flu shot.  Patient states that she does not take the flu shot. 

## 2014-08-14 ENCOUNTER — Other Ambulatory Visit: Payer: Self-pay

## 2014-08-14 MED ORDER — LEVOTHYROXINE SODIUM 25 MCG PO TABS: ORAL_TABLET | ORAL | Status: DC

## 2014-08-16 ENCOUNTER — Other Ambulatory Visit: Payer: Self-pay

## 2014-08-16 MED ORDER — LEVOTHYROXINE SODIUM 25 MCG PO TABS: ORAL_TABLET | ORAL | Status: DC

## 2014-08-19 ENCOUNTER — Other Ambulatory Visit: Payer: Self-pay | Admitting: Physician Assistant

## 2014-08-20 NOTE — Telephone Encounter (Signed)
Old message °

## 2014-09-10 ENCOUNTER — Other Ambulatory Visit: Payer: Self-pay

## 2014-09-10 MED ORDER — CLONAZEPAM 0.5 MG PO TABS: 0.5000 mg | ORAL_TABLET | Freq: Two times a day (BID) | ORAL | Status: DC

## 2014-09-10 NOTE — Telephone Encounter (Signed)
Clonazepam refill phoned to pt's pharmacy. 

## 2014-09-10 NOTE — Telephone Encounter (Signed)
Pharm reqs RF of clonazepam. Pended. Pt has appt sch 10/03/14.

## 2014-09-15 ENCOUNTER — Other Ambulatory Visit: Payer: Self-pay | Admitting: Physician Assistant

## 2014-09-24 ENCOUNTER — Ambulatory Visit: Payer: Medicare HMO | Admitting: Podiatry

## 2014-09-24 ENCOUNTER — Other Ambulatory Visit: Payer: Self-pay | Admitting: Family Medicine

## 2014-10-03 ENCOUNTER — Ambulatory Visit: Payer: Medicare HMO | Admitting: Family Medicine

## 2014-10-08 ENCOUNTER — Encounter: Payer: Self-pay | Admitting: Family Medicine

## 2014-10-08 ENCOUNTER — Ambulatory Visit (INDEPENDENT_AMBULATORY_CARE_PROVIDER_SITE_OTHER): Payer: Medicare HMO | Admitting: Family Medicine

## 2014-10-08 VITALS — BP 118/66 | HR 90 | Temp 97.3°F | Resp 16 | Ht 66.0 in | Wt 176.2 lb

## 2014-10-08 DIAGNOSIS — Z1239 Encounter for other screening for malignant neoplasm of breast: Secondary | ICD-10-CM | POA: Diagnosis not present

## 2014-10-08 DIAGNOSIS — E038 Other specified hypothyroidism: Secondary | ICD-10-CM | POA: Diagnosis not present

## 2014-10-08 DIAGNOSIS — E034 Atrophy of thyroid (acquired): Secondary | ICD-10-CM | POA: Diagnosis not present

## 2014-10-08 DIAGNOSIS — Z9181 History of falling: Secondary | ICD-10-CM

## 2014-10-08 DIAGNOSIS — L309 Dermatitis, unspecified: Secondary | ICD-10-CM | POA: Diagnosis not present

## 2014-10-08 DIAGNOSIS — I10 Essential (primary) hypertension: Secondary | ICD-10-CM

## 2014-10-08 DIAGNOSIS — F411 Generalized anxiety disorder: Secondary | ICD-10-CM | POA: Diagnosis not present

## 2014-10-08 DIAGNOSIS — M5137 Other intervertebral disc degeneration, lumbosacral region: Secondary | ICD-10-CM

## 2014-10-08 LAB — TSH: TSH: 3.642 u[IU]/mL (ref 0.350–4.500)

## 2014-10-08 LAB — BASIC METABOLIC PANEL
BUN: 29 mg/dL — ABNORMAL HIGH (ref 6–23)
CALCIUM: 9.5 mg/dL (ref 8.4–10.5)
CO2: 26 mEq/L (ref 19–32)
CREATININE: 1.26 mg/dL — AB (ref 0.50–1.10)
Chloride: 98 mEq/L (ref 96–112)
Glucose, Bld: 84 mg/dL (ref 70–99)
Potassium: 4.8 mEq/L (ref 3.5–5.3)
Sodium: 132 mEq/L — ABNORMAL LOW (ref 135–145)

## 2014-10-08 LAB — T4, FREE: Free T4: 1.17 ng/dL (ref 0.80–1.80)

## 2014-10-08 MED ORDER — AMMONIUM LACTATE 12 % EX LOTN: 1.0000 "application " | TOPICAL_LOTION | CUTANEOUS | Status: DC | PRN

## 2014-10-08 MED ORDER — CLONAZEPAM 0.5 MG PO TABS: 0.5000 mg | ORAL_TABLET | Freq: Two times a day (BID) | ORAL | Status: DC

## 2014-10-08 MED ORDER — SPIRONOLACTONE 25 MG PO TABS: ORAL_TABLET | ORAL | Status: DC

## 2014-10-08 MED ORDER — LISINOPRIL 20 MG PO TABS: ORAL_TABLET | ORAL | Status: DC

## 2014-10-08 NOTE — Progress Notes (Signed)
Subjective:    Patient ID: Christy Luna, female    DOB: 1946/04/27, 69 y.o.   MRN: [id number]  HPI  This 69 y.o. Female is here w/ her sister-in-law who states pt is doing fairly well and no new problems have surfaced. Pt has HTN, chronic anxiety, hypothyroidism and recurrent dermatitis. Pt lives alone but has involved family members. She is compliant with medications.She has chronic LBP and ambulates w/ a walker. In-home PT was effective but discontinued after 4-5 visits. Pt is not doing home exercises as instructed. Pt is very sedentary and states that the exercises were hard; she lacks motivation to do them.  Friend of family offered pt a modern rolling walker w/ seat and brake attachment but pt did not like it.   Pt is requesting prescription for bedside commode and new standard walker. Sister-in-law reports some urinary incontinence when pt first gets up in the morning; she has dribbling before she can get to toilet.  Dermatitis- dry skin >> itching and outbreak w/ reddish papules. Not using steroid cream prescribed by DERM last year. Pt does not want to use any steroid products, oral or topical.   Preventative screenings- pt needs MMG scheduled and sister-in-law will take her for eye exam w/ local optometrist.  Patient Active Problem List   Diagnosis Date Noted  . Unspecified constipation 04/04/2014  . Essential hypertension 04/04/2014  . Dermatitis 07/20/2013  . Generalized anxiety disorder 02/16/2013  . Hypothyroidism 02/16/2013  . Raynaud's disease 02/16/2013  . Lower extremity venous stasis 02/16/2013    Prior to Admission medications   Medication Sig Start Date End Date Taking? Authorizing Provider  clonazePAM (KLONOPIN) 0.5 MG tablet Take 1 tablet (0.5 mg total) by mouth 2 (two) times daily.   Yes Maurice March, MD  ergocalciferol (DRISDOL) 50000 UNITS capsule Take 1 capsule (50,000 Units total) by mouth once a week. 04/04/14 04/04/15 Yes Maurice March, MD    levothyroxine (SYNTHROID, LEVOTHROID) 25 MCG tablet TAKE ONE TABLET BY MOUTH ONCE DAILY 09/25/14  Yes Chelle S Jeffery, PA-C  lisinopril (PRINIVIL,ZESTRIL) 20 MG tablet TAKE 1 TABLET (20 MG TOTAL) BY MOUTH DAILY.   Yes Maurice March, MD  spironolactone (ALDACTONE) 25 MG tablet TAKE 1 TABLET EVERY MORNING FOR LEG EDEMA   Yes Maurice March, MD  triamcinolone cream (KENALOG) 0.1 % Apply 1 application topically 2 (two) times daily.   Yes Historical Provider, MD  venlafaxine XR (EFFEXOR-XR) 150 MG 24 hr capsule TAKE 1 CAPSULE (150 MG TOTAL) BY MOUTH DAILY. 04/04/14  Yes Maurice March, MD  zoster vaccine live, PF, (ZOSTAVAX) 91694 UNT/0.65ML injection Inject 19,400 Units into the skin once. 10/26/13   Maurice March, MD    History   Social History  . Marital Status: Single    Spouse Name: N/A  . Number of Children: N/A  . Years of Education: N/A   Occupational History  . Not on file.   Social History Main Topics  . Smoking status: Never Smoker   . Smokeless tobacco: Not on file  . Alcohol Use: No  . Drug Use: No  . Sexual Activity: Not on file   Other Topics Concern  . Not on file   Social History Narrative    Family History  Problem Relation Age of Onset  . Heart disease Father   . Hypertension Father   . Hyperlipidemia Father   . Heart disease Brother   . Arthritis Brother   . Hyperlipidemia Brother   .  Hypertension Brother   . Tuberculosis Maternal Grandmother   . Heart disease Maternal Grandfather   . Heart disease Paternal Grandmother   . Cancer Paternal Grandfather   . Mental illness Mother   . Hypertension Mother     Review of Systems  Constitutional: Negative.   HENT: Negative.   Eyes: Negative.   Respiratory: Negative for cough, chest tightness and shortness of breath.   Cardiovascular: Negative for chest pain and palpitations.  Musculoskeletal: Positive for back pain, arthralgias and gait problem.  Skin: Positive for rash.   Neurological: Negative.       Objective:   Physical Exam  Constitutional: She is oriented to person, place, and time. She appears well-developed and well-nourished. No distress.  HENT:  Head: Normocephalic and atraumatic.  Right Ear: External ear normal.  Left Ear: External ear normal.  Mouth/Throat: Oropharynx is clear and moist.  Eyes: Conjunctivae and EOM are normal. Pupils are equal, round, and reactive to light. No scleral icterus.  Neck: Normal range of motion. Neck supple. No thyromegaly present.  Cardiovascular: Normal rate, regular rhythm and normal heart sounds.  Exam reveals no gallop.   No murmur heard. Pulmonary/Chest: Effort normal and breath sounds normal. No respiratory distress.  Lymphadenopathy:    She has no cervical adenopathy.  Neurological: She is alert and oriented to person, place, and time. No cranial nerve deficit or sensory deficit. Gait abnormal. Coordination normal.  Gait- stiff and shuffling; pt bent forward slightly over walker.  Skin: Skin is warm, dry and intact. Rash noted. She is not diaphoretic. No pallor.  Skin is very dry and leathery w/ fair turgor. Pinpoint papules on extremities.  Psychiatric: She has a normal mood and affect. Thought content normal. Her speech is delayed. She is inattentive.  Nursing note and vitals reviewed.      Assessment & Plan:  Dermatitis- RX: Lac- Hydrin lotion (generic); apply twice a day.  Hypothyroidism due to acquired atrophy of thyroid - Refills pending lab results. Plan: TSH, T4, Free  Essential hypertension - Stable on current medications. Plan: Basic metabolic panel  Generalized anxiety disorder- Refill clonazepam 0.5 mg 1 bid.  Screening for breast cancer - Plan: MM Digital Screening  Disc disease, degenerative, lumbar or lumbosacral - Plan: DME Bedside commode, Walker rolling  At risk for falls - Plan: Walker rolling   Meds ordered this encounter  Medications  . ammonium lactate (LAC-HYDRIN)  12 % lotion    Sig: Apply 1 application topically as needed for dry skin.    Dispense:  500 g    Refill:  5  . clonazePAM (KLONOPIN) 0.5 MG tablet    Sig: Take 1 tablet (0.5 mg total) by mouth 2 (two) times daily.    Dispense:  60 tablet    Refill:  2  . lisinopril (PRINIVIL,ZESTRIL) 20 MG tablet    Sig: TAKE 1 TABLET (20 MG TOTAL) BY MOUTH DAILY.    Dispense:  30 tablet    Refill:  11  . spironolactone (ALDACTONE) 25 MG tablet    Sig: TAKE 1 TABLET EVERY MORNING FOR LEG EDEMA    Dispense:  30 tablet    Refill:  5

## 2014-10-08 NOTE — Patient Instructions (Signed)
Please so your exercises to help strengthen your core muscles (abdomen and back) as well as your leg muscles. This will reduce your risk of falling so that you can remain in your home and be independent for as long as possible.

## 2014-10-11 ENCOUNTER — Other Ambulatory Visit: Payer: Self-pay | Admitting: Family Medicine

## 2014-10-11 MED ORDER — LEVOTHYROXINE SODIUM 25 MCG PO TABS: ORAL_TABLET | ORAL | Status: DC

## 2014-10-18 ENCOUNTER — Telehealth: Payer: Self-pay

## 2014-10-18 NOTE — Telephone Encounter (Signed)
Last time the pt was seen by Dr. Audria Nine a potty chair and a new walker were recommended, yet the pt never heard anything further on this. She wanted to check on the status of this.

## 2014-10-21 NOTE — Telephone Encounter (Signed)
Dr Audria Nine, do you know what the status of these items is?

## 2014-10-22 NOTE — Telephone Encounter (Signed)
Pt /family member were given printed scripts for BEDSIDE COMMODE and WALKER. They were instructed to take them to a local durable medical equipment store (like [facility] or [facility]) and that facility would take care of ordering this equipment for them. If they have done that and are still having problems, they may need to call pt's MEDICARE provider/insurer.

## 2014-10-23 NOTE — Telephone Encounter (Signed)
Spoke with pt, she already received her supplies from [facility].

## 2014-11-21 ENCOUNTER — Other Ambulatory Visit: Payer: Self-pay | Admitting: Family Medicine

## 2015-01-10 ENCOUNTER — Encounter: Payer: Self-pay | Admitting: *Deleted

## 2015-01-21 ENCOUNTER — Other Ambulatory Visit: Payer: Self-pay | Admitting: Family Medicine

## 2015-01-21 DIAGNOSIS — F411 Generalized anxiety disorder: Secondary | ICD-10-CM

## 2015-01-21 NOTE — Telephone Encounter (Signed)
Rx printed and ready for pick up. Prescribed #60 tablets. She will need to return and establish care with new provider to get further refills.

## 2015-01-22 MED ORDER — CLONAZEPAM 0.5 MG PO TABS
0.5000 mg | ORAL_TABLET | Freq: Two times a day (BID) | ORAL | Status: DC
Start: 2015-01-22 — End: 2015-07-14

## 2015-01-22 NOTE — Telephone Encounter (Signed)
OK to provide with 3 refills of Clonazepam until OV with me in 04/2015.  Please send in refills via fax or phone. Thanks.

## 2015-01-22 NOTE — Telephone Encounter (Signed)
Dr [name], pt was given 1 mos RF of clonazepam, but Joni Reining had advised she needs to RTC to est w/new provider for more RFs. She has appt sch with you to est care in Sept, which is 6 mos after her last OV w/Dr Audria Nine. Do you want me to tell pt she needs to come to walk in bf seeing you, or will you authorize RFs until you see her for appt? I will pend with RFs to put on hold through her appt, or if you want to do just month to month, if you authorize it here, I will send RF reqs to you or PAs until then.

## 2015-01-23 NOTE — Telephone Encounter (Signed)
Rx called in 

## 2015-03-09 ENCOUNTER — Telehealth: Payer: Self-pay | Admitting: Family Medicine

## 2015-03-09 NOTE — Telephone Encounter (Signed)
No answer i called to remind patient that her appt was moved from 04/16/15 to 04/18/15 at 2:00

## 2015-04-11 ENCOUNTER — Other Ambulatory Visit: Payer: Self-pay | Admitting: Family Medicine

## 2015-04-11 MED ORDER — SPIRONOLACTONE 25 MG PO TABS: ORAL_TABLET | ORAL | Status: DC

## 2015-04-15 ENCOUNTER — Encounter: Payer: Medicare HMO | Admitting: Family Medicine

## 2015-04-16 ENCOUNTER — Encounter: Payer: Self-pay | Admitting: Family Medicine

## 2015-04-17 ENCOUNTER — Telehealth: Payer: Self-pay

## 2015-04-17 MED ORDER — LEVOTHYROXINE SODIUM 25 MCG PO TABS: ORAL_TABLET | ORAL | Status: DC

## 2015-04-17 NOTE — Telephone Encounter (Signed)
Pt would like a refill on her levothyroxine (SYNTHROID, LEVOTHROID) 25 MCG tablet [[id number]]. Please advise at [phone]

## 2015-04-17 NOTE — Telephone Encounter (Signed)
Rx sent.  Pt notified. 

## 2015-04-18 ENCOUNTER — Encounter: Payer: Self-pay | Admitting: Family Medicine

## 2015-04-18 IMAGING — CR DG TIBIA/FIBULA 2V*R*
3 series · 3 of 3 positions shown · non-contrast
Comparison: None.

CLINICAL DATA: Leg pain/swelling

EXAM:
RIGHT TIBIA AND FIBULA - 2 VIEW

[AP]
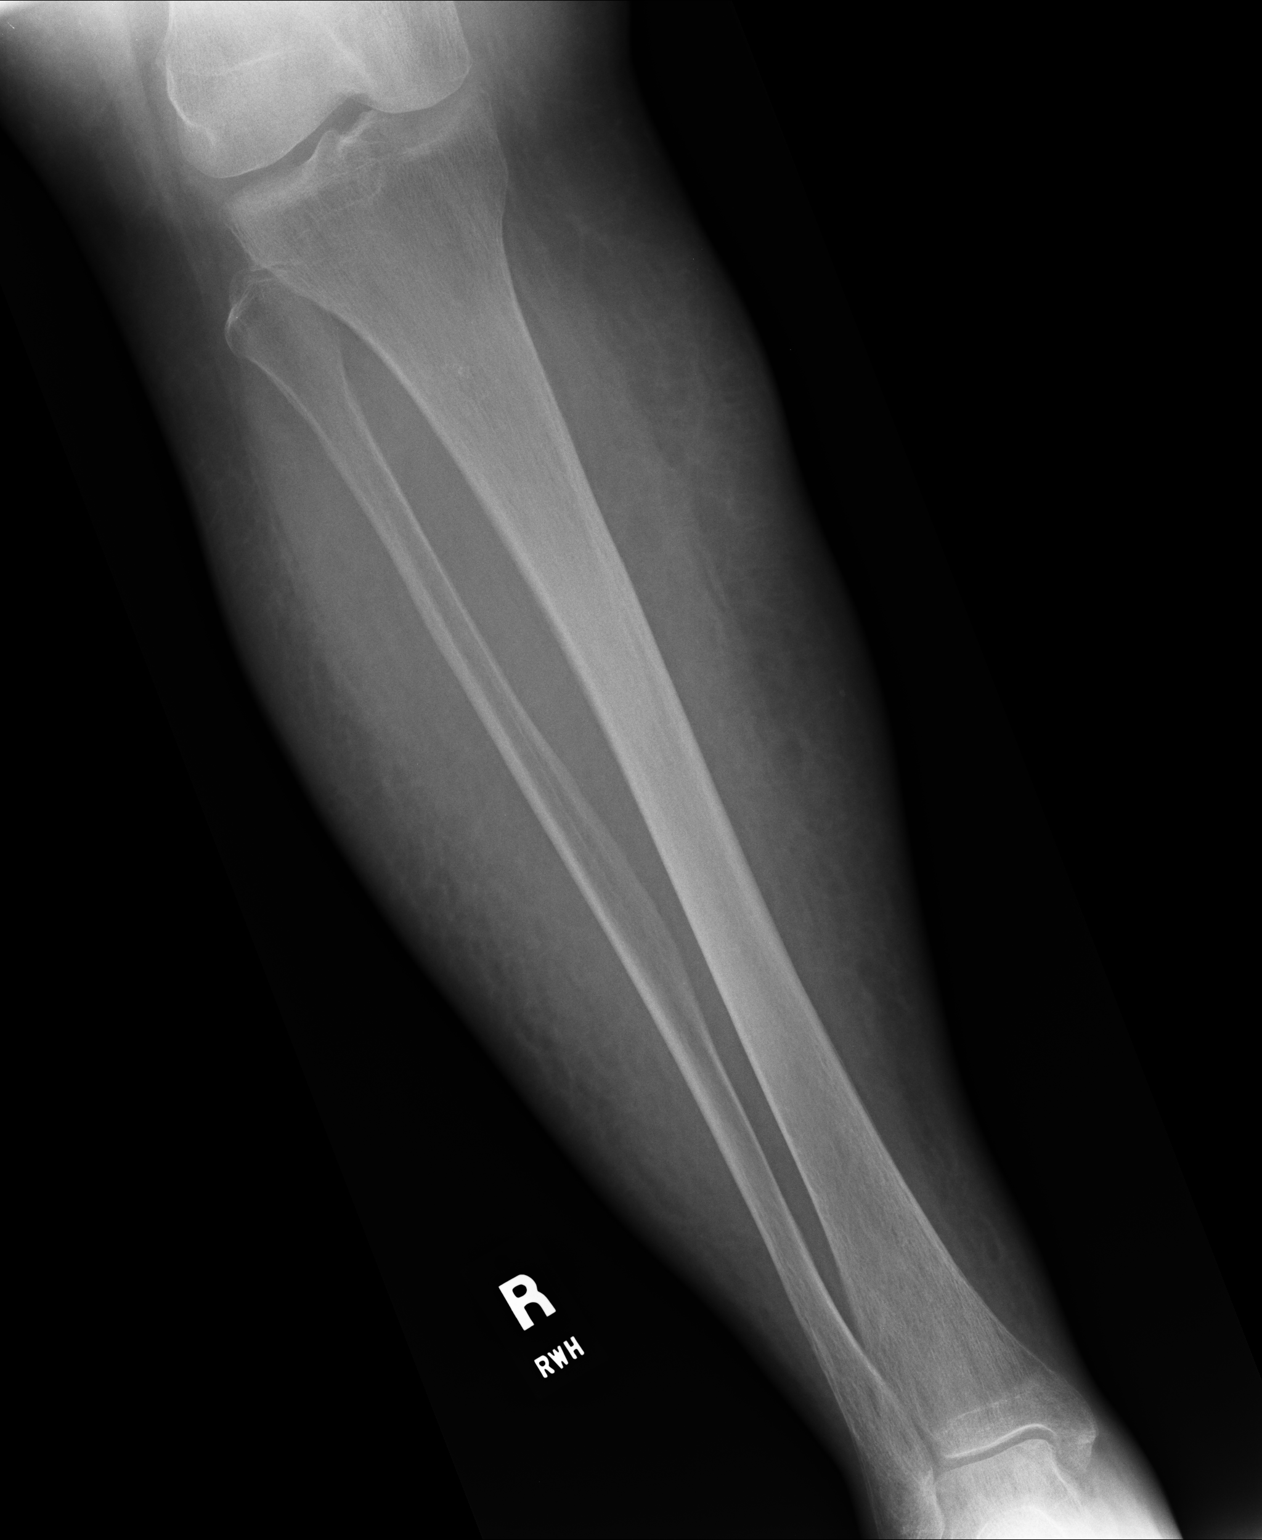

[lateral (1 of 2)]
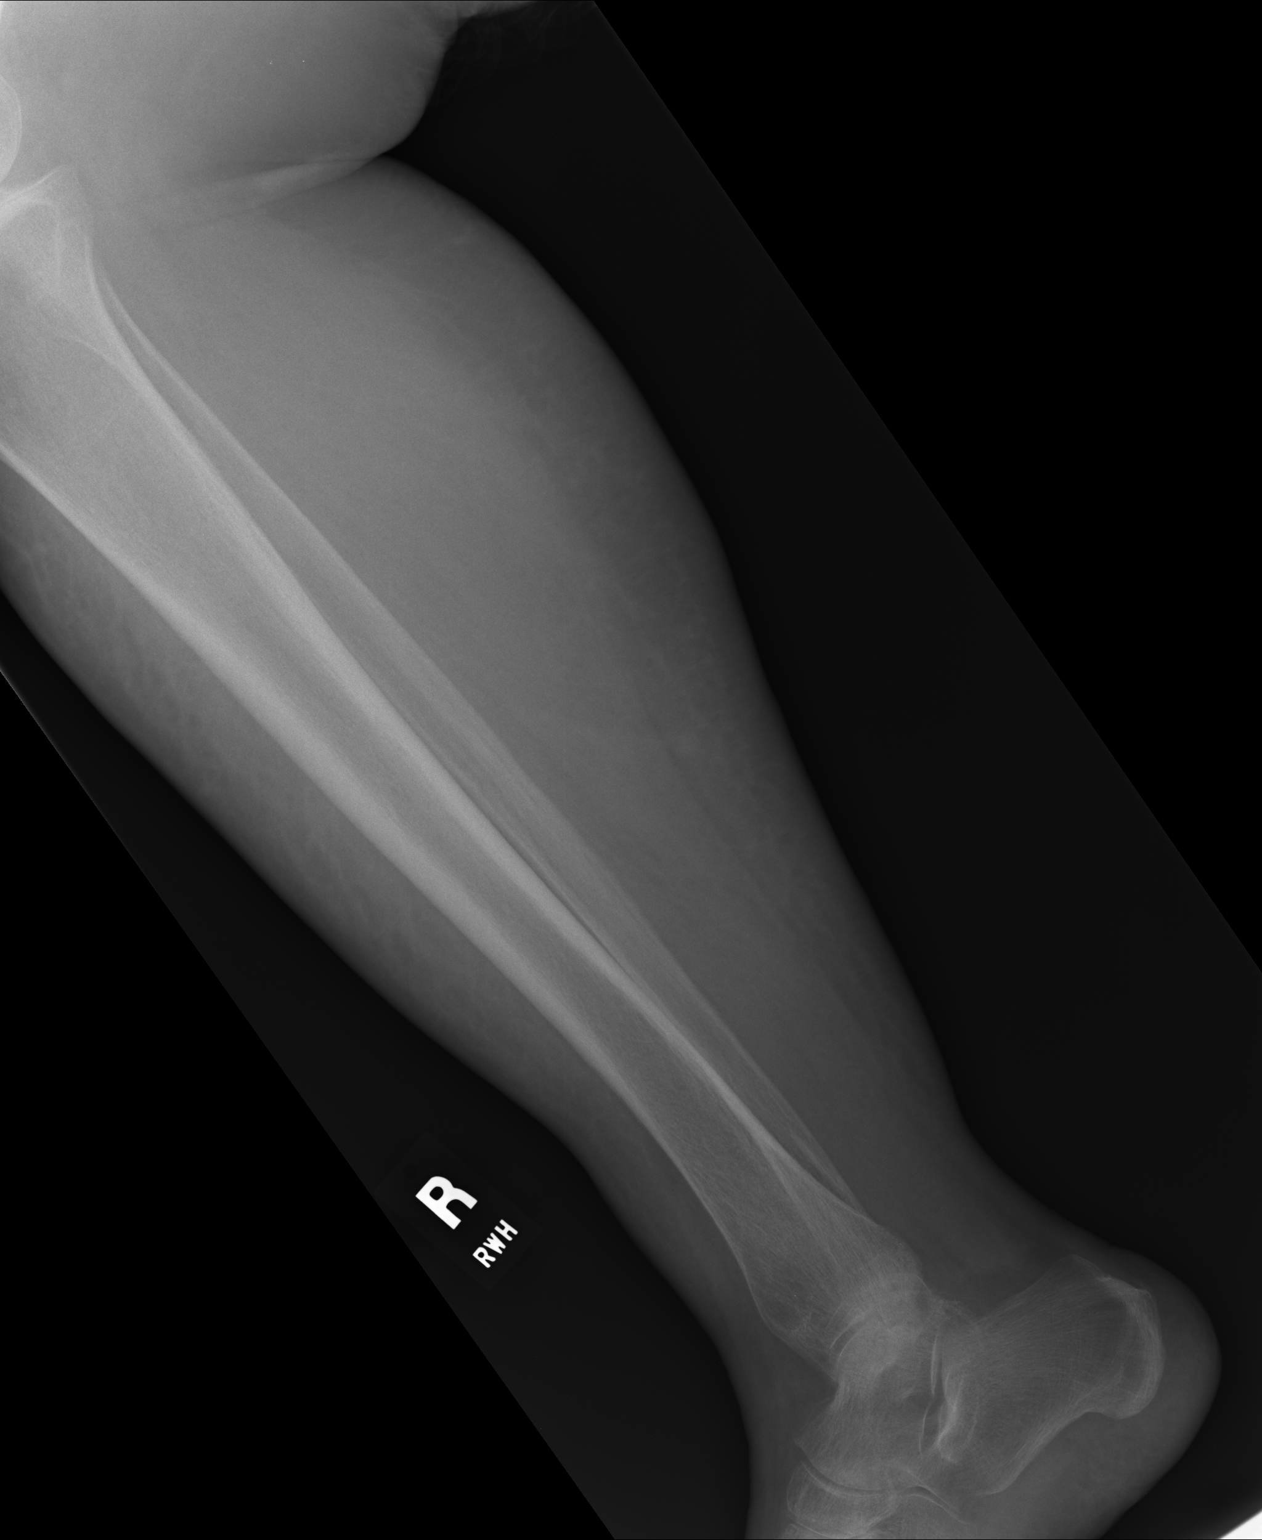

[lateral (2 of 2)]
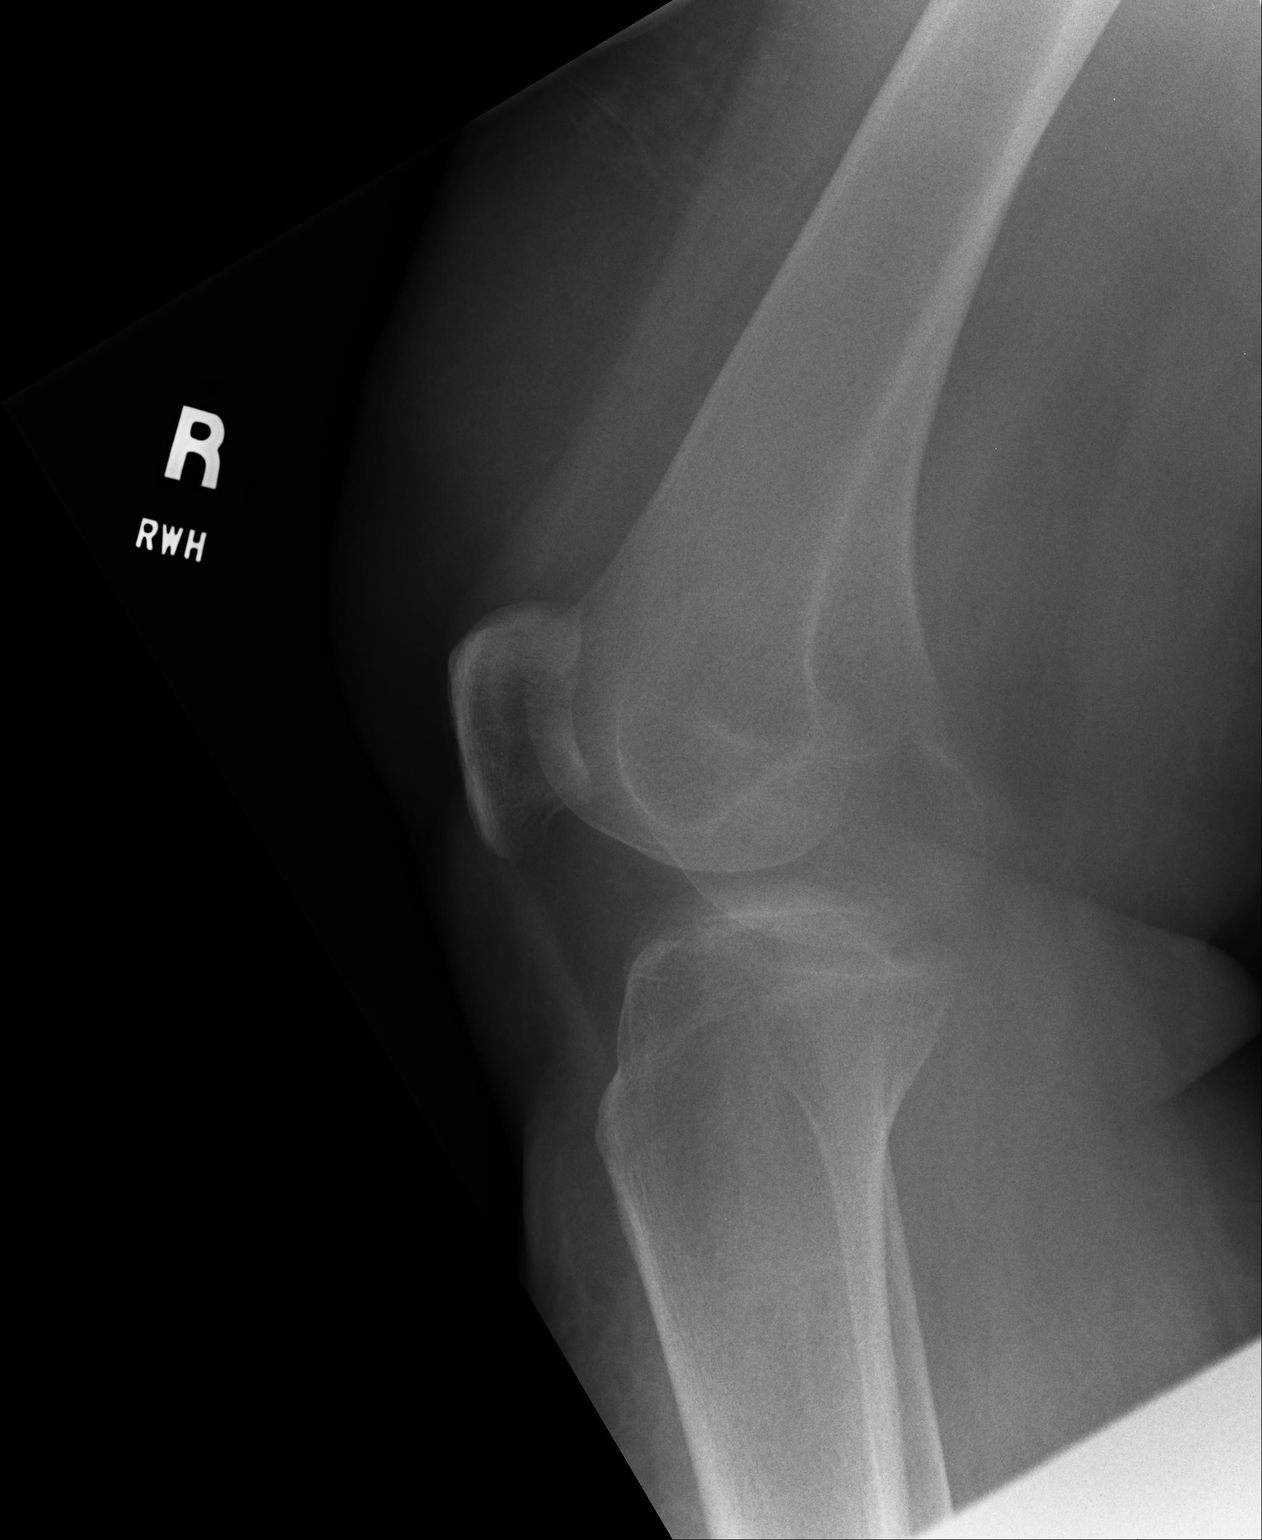

[3 of 3 positions shown; findings below may reference images not displayed]

FINDINGS: No fracture or dislocation is seen.

The joint spaces are preserved.

Visualized soft tissues are grossly unremarkable.
IMPRESSION: No fracture or dislocation is seen.

## 2015-04-29 ENCOUNTER — Other Ambulatory Visit: Payer: Self-pay

## 2015-04-29 MED ORDER — VENLAFAXINE HCL ER 150 MG PO CP24: ORAL_CAPSULE | ORAL | Status: DC

## 2015-04-29 NOTE — Telephone Encounter (Signed)
Yes, this is fine. Chelle refilled.

## 2015-04-29 NOTE — Telephone Encounter (Signed)
Meds ordered this encounter  Medications  . venlafaxine XR (EFFEXOR-XR) 150 MG 24 hr capsule    Sig: TAKE 1 CAPSULE (150 MG TOTAL) BY MOUTH DAILY.    Dispense:  30 capsule    Refill:  2

## 2015-04-29 NOTE — Telephone Encounter (Signed)
Pt called and said she is out of her venlafaxine and needs RFs until her appt w/Dr [name] 07/21/15 to est care since Dr Audria Nine left. Can you approve the RFs until then?

## 2015-05-13 ENCOUNTER — Other Ambulatory Visit: Payer: Self-pay | Admitting: Physician Assistant

## 2015-05-21 ENCOUNTER — Other Ambulatory Visit: Payer: Self-pay

## 2015-05-21 MED ORDER — VITAMIN D (ERGOCALCIFEROL) 1.25 MG (50000 UNIT) PO CAPS: 50000.0000 [IU] | ORAL_CAPSULE | ORAL | Status: DC

## 2015-06-18 ENCOUNTER — Other Ambulatory Visit: Payer: Self-pay | Admitting: Physician Assistant

## 2015-07-14 ENCOUNTER — Other Ambulatory Visit: Payer: Self-pay | Admitting: Physician Assistant

## 2015-07-14 ENCOUNTER — Other Ambulatory Visit: Payer: Self-pay | Admitting: Family Medicine

## 2015-07-14 NOTE — Telephone Encounter (Signed)
Appt 07/21/15.

## 2015-07-15 ENCOUNTER — Other Ambulatory Visit: Payer: Self-pay | Admitting: Family Medicine

## 2015-07-15 NOTE — Telephone Encounter (Signed)
Please call in refill of Clonazepam as approved. 

## 2015-07-15 NOTE — Telephone Encounter (Signed)
Called in.

## 2015-07-21 ENCOUNTER — Ambulatory Visit (INDEPENDENT_AMBULATORY_CARE_PROVIDER_SITE_OTHER): Payer: Medicare HMO | Admitting: Family Medicine

## 2015-07-21 ENCOUNTER — Encounter: Payer: Self-pay | Admitting: Family Medicine

## 2015-07-21 VITALS — BP 117/73 | HR 91 | Temp 97.9°F | Resp 16 | Ht 66.5 in | Wt 163.4 lb

## 2015-07-21 DIAGNOSIS — E034 Atrophy of thyroid (acquired): Secondary | ICD-10-CM

## 2015-07-21 DIAGNOSIS — F411 Generalized anxiety disorder: Secondary | ICD-10-CM | POA: Diagnosis not present

## 2015-07-21 DIAGNOSIS — I7300 Raynaud's syndrome without gangrene: Secondary | ICD-10-CM | POA: Diagnosis not present

## 2015-07-21 DIAGNOSIS — R69 Illness, unspecified: Secondary | ICD-10-CM | POA: Diagnosis not present

## 2015-07-21 DIAGNOSIS — E038 Other specified hypothyroidism: Secondary | ICD-10-CM

## 2015-07-21 DIAGNOSIS — I10 Essential (primary) hypertension: Secondary | ICD-10-CM | POA: Diagnosis not present

## 2015-07-21 DIAGNOSIS — I878 Other specified disorders of veins: Secondary | ICD-10-CM | POA: Diagnosis not present

## 2015-07-21 DIAGNOSIS — E7800 Pure hypercholesterolemia, unspecified: Secondary | ICD-10-CM | POA: Diagnosis not present

## 2015-07-21 DIAGNOSIS — Z1159 Encounter for screening for other viral diseases: Secondary | ICD-10-CM

## 2015-07-21 DIAGNOSIS — K5901 Slow transit constipation: Secondary | ICD-10-CM | POA: Diagnosis not present

## 2015-07-21 DIAGNOSIS — Z0000 Encounter for general adult medical examination without abnormal findings: Secondary | ICD-10-CM | POA: Diagnosis not present

## 2015-07-21 LAB — LIPID PANEL
CHOL/HDL RATIO: 3.6 ratio (ref <=5.0)
CHOLESTEROL: 226 mg/dL — AB (ref 125–200)
HDL: 63 mg/dL (ref >=46)
LDL Cholesterol: 144 mg/dL — ABNORMAL HIGH (ref <130)
TRIGLYCERIDES: 95 mg/dL (ref <150)
VLDL: 19 mg/dL (ref <30)

## 2015-07-21 LAB — CBC WITH DIFFERENTIAL/PLATELET
Basophils Absolute: 0.1 10*3/uL (ref 0.0–0.1)
Basophils Relative: 1 % (ref 0–1)
EOS PCT: 4 % (ref 0–5)
Eosinophils Absolute: 0.4 10*3/uL (ref 0.0–0.7)
HEMATOCRIT: 33.8 % — AB (ref 36.0–46.0)
HEMOGLOBIN: 11.5 g/dL — AB (ref 12.0–15.0)
LYMPHS ABS: 1.6 10*3/uL (ref 0.7–4.0)
LYMPHS PCT: 17 % (ref 12–46)
MCH: 28.4 pg (ref 26.0–34.0)
MCHC: 34 g/dL (ref 30.0–36.0)
MCV: 83.5 fL (ref 78.0–100.0)
MONO ABS: 0.8 10*3/uL (ref 0.1–1.0)
MPV: 11.4 fL (ref 8.6–12.4)
Monocytes Relative: 9 % (ref 3–12)
NEUTROS ABS: 6.5 10*3/uL (ref 1.7–7.7)
Neutrophils Relative %: 69 % (ref 43–77)
Platelets: 268 10*3/uL (ref 150–400)
RBC: 4.05 MIL/uL (ref 3.87–5.11)
RDW: 14.4 % (ref 11.5–15.5)
WBC: 9.4 10*3/uL (ref 4.0–10.5)

## 2015-07-21 LAB — COMPREHENSIVE METABOLIC PANEL
ALK PHOS: 68 U/L (ref 33–130)
ALT: 15 U/L (ref 6–29)
AST: 23 U/L (ref 10–35)
Albumin: 4.1 g/dL (ref 3.6–5.1)
BUN: 31 mg/dL — ABNORMAL HIGH (ref 7–25)
CALCIUM: 9.3 mg/dL (ref 8.6–10.4)
CHLORIDE: 101 mmol/L (ref 98–110)
CO2: 23 mmol/L (ref 20–31)
Creat: 1.33 mg/dL — ABNORMAL HIGH (ref 0.50–0.99)
Glucose, Bld: 78 mg/dL (ref 65–99)
POTASSIUM: 4.7 mmol/L (ref 3.5–5.3)
Sodium: 135 mmol/L (ref 135–146)
TOTAL PROTEIN: 7 g/dL (ref 6.1–8.1)
Total Bilirubin: 0.6 mg/dL (ref 0.2–1.2)

## 2015-07-21 MED ORDER — LEVOTHYROXINE SODIUM 25 MCG PO TABS: ORAL_TABLET | ORAL | Status: DC

## 2015-07-21 MED ORDER — VENLAFAXINE HCL ER 150 MG PO CP24: ORAL_CAPSULE | ORAL | Status: DC

## 2015-07-21 MED ORDER — CLONAZEPAM 0.5 MG PO TABS: 0.5000 mg | ORAL_TABLET | Freq: Three times a day (TID) | ORAL | Status: DC | PRN

## 2015-07-21 MED ORDER — SPIRONOLACTONE 25 MG PO TABS: 25.0000 mg | ORAL_TABLET | Freq: Every day | ORAL | Status: DC

## 2015-07-21 MED ORDER — LISINOPRIL 20 MG PO TABS: ORAL_TABLET | ORAL | Status: DC

## 2015-07-21 NOTE — Progress Notes (Signed)
Subjective:    Patient ID: Christy Luna, female    DOB: 1945-11-13, 69 y.o.   MRN: [id number]  07/21/2015  Annual Exam and Anxiety   HPI This 69 y.o. female presents for Annual Wellness Examination.  Last physical: 04-04-2014 Pap smear:  2 years ago maybe; never had a pap smear. Afraid of physicians.  Menopause age 57. Mammogram:  Never; afraid of stuies.  Now agreeable.   Colonoscopy:  Dr. Loreta Ave declined due to mobility.  Stool cards completed. Bone density:  Never. TDAP:  never Pneumovax:  never Zostavax:  never Influenza:  never Eye exam:  Working on this; no previous exam; no glasses Dental exam: never/none  Anxiety and depression: Patient reports good compliance with medication, good tolerance to medication, and good symptom control.  Taking Clonazepam 0.5mg  bid; has a lot of anxiety in the evenings and first thing in the morning.   Effexor XR 150mg  daily; does not like taking Effexor.    Started Effexor XR for four years; has been on Klonopin bid. Quit working when mother got ill; then anxiety worsened.  No previous psychiatry evaluation; pt has refused.    HTN: Patient reports good compliance with medication, good tolerance to medication, and good symptom control.  Lisinopril 20mg  daily and Aldactone 25mg  daily. Does not check BP at home.  ASA none.    Hypothyroidism: Patient reports good compliance with medication, good tolerance to medication, and good symptom control.   Levothyroxine daily. No dairy supplement.  No calcium supplement.    Enuresis/decreased UOP during day/urinary leakage: small amount of liquid with pills in morning; milk with cereal; drinks large coke in afternoon around 4:00pm.  Bedtime at 9:00pm; difficulties falling asleep.  Wakes up a lot during the night.   Review of Systems  Constitutional: Negative for fever, chills, diaphoresis, activity change, appetite change, fatigue and unexpected weight change.  HENT: Negative for congestion, dental  problem, drooling, ear discharge, ear pain, facial swelling, hearing loss, mouth sores, nosebleeds, postnasal drip, rhinorrhea, sinus pressure, sneezing, sore throat, tinnitus, trouble swallowing and voice change.   Eyes: Negative for photophobia, pain, discharge, redness, itching and visual disturbance.  Respiratory: Negative for apnea, cough, choking, chest tightness, shortness of breath, wheezing and stridor.   Cardiovascular: Negative for chest pain, palpitations and leg swelling.  Gastrointestinal: Negative for nausea, vomiting, abdominal pain, diarrhea, constipation, blood in stool, abdominal distention, anal bleeding and rectal pain.  Endocrine: Negative for cold intolerance, heat intolerance, polydipsia, polyphagia and polyuria.  Genitourinary: Negative for dysuria, urgency, frequency, hematuria, flank pain, decreased urine volume, vaginal bleeding, vaginal discharge, enuresis, difficulty urinating, genital sores, vaginal pain, menstrual problem, pelvic pain and dyspareunia.  Musculoskeletal: Negative for myalgias, back pain, joint swelling, arthralgias, gait problem, neck pain and neck stiffness.  Skin: Negative for color change, pallor, rash and wound.  Allergic/Immunologic: Negative for environmental allergies, food allergies and immunocompromised state.  Neurological: Negative for dizziness, tremors, seizures, syncope, facial asymmetry, speech difficulty, weakness, light-headedness, numbness and headaches.  Hematological: Negative for adenopathy. Does not bruise/bleed easily.  Psychiatric/Behavioral: Negative for suicidal ideas, hallucinations, behavioral problems, confusion, sleep disturbance, self-injury, dysphoric mood, decreased concentration and agitation. The patient is not nervous/anxious and is not hyperactive.     Past Medical History  Diagnosis Date  . Thyroid disease   . Anxiety   . Hypertension   . Depression   . Arthritis     DDD lumbar   History reviewed. No  pertinent past surgical history. Allergies  Allergen Reactions  .  Augmentin [Amoxicillin-Pot Clavulanate]   . Sulfa Drugs Cross Reactors     Social History   Social History  . Marital Status: Single    Spouse Name: N/A  . Number of Children: N/A  . Years of Education: N/A   Occupational History  . Not on file.   Social History Main Topics  . Smoking status: Never Smoker   . Smokeless tobacco: Not on file  . Alcohol Use: No  . Drug Use: No  . Sexual Activity: Not on file   Other Topics Concern  . Not on file   Social History Narrative   Marital status: single; not dating; never married      Children: none      Lives: alone in house      Employment:  Retired/unemployed;      Tobacco:  Never      Alcohol:  None      Drugs: none      Exercise:  None      ADLs:  Uses rolling walker 100% of time due to unsteadiness; quit driving in 4469; no cooking; house cleaner and washes clothes CNA; CNA helps with dressing, bathing; CNA five days per week 2-4 hours per day.  CNA grocery shopping; pays bills.        Advanced Directives:  HCPOA:  Sister-in-law Matteson Petrich [phone]).  +living will --- FULL CODE but no prolonged measures.           Family History  Problem Relation Age of Onset  . Heart disease Father 55    CAD/CABG  . Hypertension Father   . Hyperlipidemia Father   . Heart disease Brother 97    CABG  . Arthritis Brother   . Hyperlipidemia Brother   . Hypertension Brother   . Tuberculosis Maternal Grandmother   . Heart disease Maternal Grandfather   . Heart disease Paternal Grandmother   . Cancer Paternal Grandfather   . Mental illness Mother   . Hypertension Mother   . Gout Mother   . Hypertension Brother        Objective:    BP 117/73 mmHg  Pulse 91  Temp(Src) 97.9 F (36.6 C) (Oral)  Resp 16  Ht 5' 6.5" (1.689 m)  Wt 163 lb 6.4 oz (74.118 kg)  BMI 25.98 kg/m2  SpO2 97% Physical Exam  Constitutional: She is oriented to person, place, and  time. She appears well-developed and well-nourished. No distress.  HENT:  Head: Normocephalic and atraumatic.  Right Ear: External ear normal.  Left Ear: External ear normal.  Nose: Nose normal.  Mouth/Throat: Oropharynx is clear and moist.  Eyes: Conjunctivae and EOM are normal. Pupils are equal, round, and reactive to light.  Neck: Normal range of motion and full passive range of motion without pain. Neck supple. No JVD present. Carotid bruit is not present. No thyromegaly present.  Cardiovascular: Normal rate, regular rhythm and normal heart sounds.  Exam reveals no gallop and no friction rub.   No murmur heard. Pulmonary/Chest: Effort normal and breath sounds normal. She has no wheezes. She has no rales.  Abdominal: Soft. Bowel sounds are normal. She exhibits no distension and no mass. There is no tenderness. There is no rebound and no guarding.  Musculoskeletal:       Right shoulder: Normal.       Left shoulder: Normal.       Cervical back: Normal.  Lymphadenopathy:    She has no cervical adenopathy.  Neurological: She is alert and  oriented to person, place, and time. She has normal reflexes. No cranial nerve deficit. She exhibits normal muscle tone. Coordination normal.  Skin: Skin is warm and dry. No rash noted. She is not diaphoretic. No erythema. No pallor.  Psychiatric: She has a normal mood and affect. Her behavior is normal. Judgment and thought content normal.  Nursing note and vitals reviewed.       Assessment & Plan:   1. Encounter for Medicare annual wellness exam   2. Routine physical examination   3. Raynaud's disease   4. Lower extremity venous stasis   5. Generalized anxiety disorder   6. Slow transit constipation   7. Hypothyroidism due to acquired atrophy of thyroid   8. Essential hypertension   9. Need for hepatitis C screening test   10. Pure hypercholesterolemia     Orders Placed This Encounter  Procedures  . CBC with Differential/Platelet  .  Comprehensive metabolic panel    Order Specific Question:  Has the patient fasted?    Answer:  Yes  . Lipid panel    Order Specific Question:  Has the patient fasted?    Answer:  Yes  . Hepatitis C antibody  . TSH  . T4, free  . POCT urinalysis dipstick   Meds ordered this encounter  Medications  . spironolactone (ALDACTONE) 25 MG tablet    Sig: Take 1 tablet (25 mg total) by mouth daily.    Dispense:  90 tablet    Refill:  1  . levothyroxine (SYNTHROID, LEVOTHROID) 25 MCG tablet    Sig: TAKE ONE TABLET BY MOUTH ONCE DAILY    Dispense:  90 tablet    Refill:  3  . lisinopril (PRINIVIL,ZESTRIL) 20 MG tablet    Sig: TAKE 1 TABLET (20 MG TOTAL) BY MOUTH DAILY.    Dispense:  90 tablet    Refill:  1  . venlafaxine XR (EFFEXOR-XR) 150 MG 24 hr capsule    Sig: TAKE 1 CAPSULE (150 MG TOTAL) BY MOUTH DAILY.    Dispense:  90 capsule    Refill:  1  . clonazePAM (KLONOPIN) 0.5 MG tablet    Sig: Take 1 tablet (0.5 mg total) by mouth 3 (three) times daily as needed.    Dispense:  90 tablet    Refill:  5    Return in about 6 months (around 01/19/2016) for recheck.    Areana Kosanke Paulita Fujita, M.D. [facility] [address] [place], [place]  [address] [phone] phone [phone] fax

## 2015-07-22 LAB — HEPATITIS C ANTIBODY: HCV Ab: NEGATIVE

## 2015-07-22 LAB — TSH: TSH: 4.414 u[IU]/mL (ref 0.350–4.500)

## 2015-07-22 LAB — T4, FREE: Free T4: 1.23 ng/dL (ref 0.80–1.80)

## 2015-07-23 ENCOUNTER — Other Ambulatory Visit: Payer: Self-pay | Admitting: Family Medicine

## 2015-07-28 NOTE — Telephone Encounter (Signed)
Dr [name], pt was in for CPE last week, but don't see vit D lab done. OK to give RFs? Last vit D lab was 31 on 04/04/14.

## 2015-07-29 NOTE — Telephone Encounter (Signed)
Call -- patient should not longer need weekly vitamin D supplement. Recommend starting OTC vitamin D 1000 IU daily.

## 2015-07-30 NOTE — Telephone Encounter (Signed)
No answ/no VM. Could not LM.

## 2015-07-31 NOTE — Telephone Encounter (Signed)
Tried again, no answ/no VM. I sent a fax back to pharm advising what Dr [name] wants pt taking.

## 2015-08-06 ENCOUNTER — Other Ambulatory Visit: Payer: Self-pay | Admitting: Family Medicine

## 2015-08-08 NOTE — Telephone Encounter (Signed)
Dr [name], pt just had CPE but don't see vit D lab or discussion recently. OK to give RFs?

## 2015-08-11 NOTE — Telephone Encounter (Signed)
Call --- patient no longer needs once weekly vitamin d supplementation; recommend she start OTC vitamin D 1000 IU daily.

## 2015-08-13 ENCOUNTER — Other Ambulatory Visit: Payer: Self-pay | Admitting: Family Medicine

## 2015-08-13 MED ORDER — ATORVASTATIN CALCIUM 10 MG PO TABS: 10.0000 mg | ORAL_TABLET | Freq: Every day | ORAL | Status: DC

## 2015-08-13 MED ORDER — LEVOTHYROXINE SODIUM 50 MCG PO TABS: ORAL_TABLET | ORAL | Status: DC

## 2015-08-13 NOTE — Addendum Note (Signed)
Addended by: Ethelda Chick on: 08/13/2015 10:40 PM   Modules accepted: Orders

## 2015-08-14 NOTE — Telephone Encounter (Signed)
Dr [name], pt just had CPE with you last month, but don't see a vit D lab done recently. Do you want to give RFs, or RTC for lab?

## 2015-08-18 ENCOUNTER — Other Ambulatory Visit: Payer: Self-pay | Admitting: Family Medicine

## 2015-08-18 NOTE — Telephone Encounter (Signed)
Call --- patient no longer needs weekly vitamin D rx; she can switch to daily OTC vitamin D 1000 IU.  Refill denied.  This is the third time I have denied this rx and sent a message that patient did not need this rx any longer. Please confirm that you have received this message and advised patient.

## 2015-08-19 NOTE — Telephone Encounter (Signed)
Noted. Thanks.

## 2015-08-19 NOTE — Telephone Encounter (Signed)
Dr. [name], I apologize this has came to you repeatedly. I spoke with pt and gave her your message. She verbalized understanding. I will have pharmacy take this off her list.

## 2015-08-20 ENCOUNTER — Other Ambulatory Visit (INDEPENDENT_AMBULATORY_CARE_PROVIDER_SITE_OTHER): Payer: Medicare HMO | Admitting: Family Medicine

## 2015-08-20 ENCOUNTER — Other Ambulatory Visit: Payer: Self-pay | Admitting: *Deleted

## 2015-08-20 DIAGNOSIS — Z1211 Encounter for screening for malignant neoplasm of colon: Secondary | ICD-10-CM | POA: Diagnosis not present

## 2015-08-20 LAB — POCT URINALYSIS DIP (MANUAL ENTRY)
BILIRUBIN UA: NEGATIVE
Bilirubin, UA: NEGATIVE
Blood, UA: NEGATIVE
Glucose, UA: NEGATIVE
NITRITE UA: POSITIVE — AB
PH UA: 6.5
Spec Grav, UA: 1.02
Urobilinogen, UA: 0.2

## 2015-08-21 LAB — IFOBT (OCCULT BLOOD): IMMUNOLOGICAL FECAL OCCULT BLOOD TEST: NEGATIVE

## 2015-09-01 ENCOUNTER — Telehealth: Payer: Self-pay

## 2015-09-01 NOTE — Telephone Encounter (Signed)
Pt called requesting that someone from the lab review her results from the stool cards that she returned to the office on 08/21/15.

## 2015-09-02 NOTE — Telephone Encounter (Signed)
Christy Luna spoke with pt.

## 2016-01-13 ENCOUNTER — Encounter: Payer: Self-pay | Admitting: Family Medicine

## 2016-01-13 ENCOUNTER — Ambulatory Visit (INDEPENDENT_AMBULATORY_CARE_PROVIDER_SITE_OTHER): Payer: Medicare HMO | Admitting: Family Medicine

## 2016-01-13 VITALS — BP 93/60 | HR 96 | Temp 98.3°F | Resp 16 | Ht 66.0 in | Wt 180.0 lb

## 2016-01-13 DIAGNOSIS — I878 Other specified disorders of veins: Secondary | ICD-10-CM

## 2016-01-13 DIAGNOSIS — E038 Other specified hypothyroidism: Secondary | ICD-10-CM | POA: Diagnosis not present

## 2016-01-13 DIAGNOSIS — I10 Essential (primary) hypertension: Secondary | ICD-10-CM | POA: Diagnosis not present

## 2016-01-13 DIAGNOSIS — L03115 Cellulitis of right lower limb: Secondary | ICD-10-CM

## 2016-01-13 DIAGNOSIS — E559 Vitamin D deficiency, unspecified: Secondary | ICD-10-CM | POA: Diagnosis not present

## 2016-01-13 DIAGNOSIS — R69 Illness, unspecified: Secondary | ICD-10-CM | POA: Diagnosis not present

## 2016-01-13 DIAGNOSIS — E7800 Pure hypercholesterolemia, unspecified: Secondary | ICD-10-CM

## 2016-01-13 DIAGNOSIS — K5901 Slow transit constipation: Secondary | ICD-10-CM | POA: Diagnosis not present

## 2016-01-13 DIAGNOSIS — F411 Generalized anxiety disorder: Secondary | ICD-10-CM | POA: Diagnosis not present

## 2016-01-13 DIAGNOSIS — I7300 Raynaud's syndrome without gangrene: Secondary | ICD-10-CM | POA: Diagnosis not present

## 2016-01-13 DIAGNOSIS — E034 Atrophy of thyroid (acquired): Secondary | ICD-10-CM | POA: Diagnosis not present

## 2016-01-13 LAB — CBC WITH DIFFERENTIAL/PLATELET
BASOS ABS: 106 {cells}/uL (ref 0–200)
Basophils Relative: 1 %
Eosinophils Absolute: 424 cells/uL (ref 15–500)
Eosinophils Relative: 4 %
HEMATOCRIT: 34.1 % — AB (ref 35.0–45.0)
Hemoglobin: 11.2 g/dL — ABNORMAL LOW (ref 11.7–15.5)
LYMPHS PCT: 17 %
Lymphs Abs: 1802 cells/uL (ref 850–3900)
MCH: 27.2 pg (ref 27.0–33.0)
MCHC: 32.8 g/dL (ref 32.0–36.0)
MCV: 82.8 fL (ref 80.0–100.0)
MONO ABS: 848 {cells}/uL (ref 200–950)
MPV: 11.1 fL (ref 7.5–12.5)
Monocytes Relative: 8 %
NEUTROS PCT: 70 %
Neutro Abs: 7420 cells/uL (ref 1500–7800)
Platelets: 386 10*3/uL (ref 140–400)
RBC: 4.12 MIL/uL (ref 3.80–5.10)
RDW: 14.4 % (ref 11.0–15.0)
WBC: 10.6 10*3/uL (ref 3.8–10.8)

## 2016-01-13 MED ORDER — VENLAFAXINE HCL ER 75 MG PO CP24: ORAL_CAPSULE | ORAL | Status: DC

## 2016-01-13 MED ORDER — LISINOPRIL 20 MG PO TABS: ORAL_TABLET | ORAL | Status: DC

## 2016-01-13 MED ORDER — KETOCONAZOLE 2 % EX CREA: 1.0000 "application " | TOPICAL_CREAM | Freq: Two times a day (BID) | CUTANEOUS | Status: DC

## 2016-01-13 MED ORDER — FLUOXETINE HCL 20 MG PO TABS: 20.0000 mg | ORAL_TABLET | Freq: Every day | ORAL | Status: DC

## 2016-01-13 MED ORDER — DOXYCYCLINE HYCLATE 100 MG PO CAPS: 100.0000 mg | ORAL_CAPSULE | Freq: Two times a day (BID) | ORAL | Status: DC

## 2016-01-13 MED ORDER — SPIRONOLACTONE 25 MG PO TABS: 12.5000 mg | ORAL_TABLET | Freq: Every day | ORAL | Status: DC

## 2016-01-13 NOTE — Progress Notes (Signed)
Subjective:    Patient ID: Christy Luna, female    DOB: 1946-05-28, 70 y.o.   MRN: [id number]  01/13/2016  Follow-up and FOOT REDNESS   HPI This 70 y.o. female presents for six month follow-up:   1.  R foot rash and redness: onset three or four weeks ago.  Started scratching foot several weeks ago at nighttime.  Redness started two weeks ago.  CMA has been wrapping foot.  Wrapping with gauze and tape.  Applying Neosporin with temporary relief; then redness worsened.  Painful for two weeks.  No fever/chills/sweats.  Drainage/gooky yellow drainage moderate amount.  No nighttime awakening.  Pain with walking.  Sandra/CNA is present; daily; 2-3 hours. Soaking with soap and water yesterday.  Past three weeks ago.  Really dry skin. Scaling.  Started healing with wrapping and preventing with scratching.  History of staph infection in R foot.  Redness started 6 weeks ago with worsening after bath yesterday.  Toenails need trimming but unable to trim them.  Toes have been blue due to Raynauds.  CNA with her for three years.    2. HTN:  Not checking BP at home.  Urinates all night long.  Does not sleep well.  Taking Lisinopril 20mg  and Spironolactone 25mg  daily.  3.  Anxiety and depression: paces to floor at night; anxious; also depressed; no motivation; totally different person; Effexor changed completely. Was on no other medication before.  Taking Klonopin 0.5mg  bid.   Seems like Clonazepam causes anxiety.  Benadryl makes hyper.    4. Hypothyroidism: Patient reports good compliance with medication, good tolerance to medication, and good symptom control.  Increased dose of levothyroxine to daily at last visit.  5.  Hypercholesterolemia: started Atorvastatin 10mg  daily at last visit.  Patient reports good compliance with medication, good tolerance to medication, and good symptom control.     Review of Systems  Constitutional: Negative for fever, chills, diaphoresis and fatigue.  HENT: Negative  for congestion.   Eyes: Negative for visual disturbance.  Respiratory: Negative for cough and shortness of breath.   Cardiovascular: Negative for chest pain, palpitations and leg swelling.  Gastrointestinal: Negative for nausea, vomiting, abdominal pain, diarrhea and constipation.  Endocrine: Negative for cold intolerance, heat intolerance, polydipsia, polyphagia and polyuria.  Genitourinary: Positive for urgency and frequency. Negative for hematuria, decreased urine volume, vaginal bleeding, vaginal discharge, genital sores and vaginal pain.  Skin: Positive for color change, rash and wound.  Neurological: Negative for dizziness, tremors, seizures, syncope, facial asymmetry, speech difficulty, weakness, light-headedness, numbness and headaches.  Psychiatric/Behavioral: Positive for sleep disturbance and dysphoric mood. Negative for suicidal ideas and self-injury. The patient is nervous/anxious.     Past Medical History  Diagnosis Date  . Thyroid disease   . Anxiety   . Hypertension   . Depression   . Arthritis     DDD lumbar   History reviewed. No pertinent past surgical history. Allergies  Allergen Reactions  . Augmentin [Amoxicillin-Pot Clavulanate]   . Sulfa Drugs Cross Reactors     Social History   Social History  . Marital Status: Single    Spouse Name: N/A  . Number of Children: N/A  . Years of Education: N/A   Occupational History  . Not on file.   Social History Main Topics  . Smoking status: Never Smoker   . Smokeless tobacco: Not on file  . Alcohol Use: No  . Drug Use: No  . Sexual Activity: Not on file   Other Topics  Concern  . Not on file   Social History Narrative   Marital status: single; not dating; never married      Children: none      Lives: alone in house      Employment:  Retired/unemployed;      Tobacco:  Never      Alcohol:  None      Drugs: none      Exercise:  None      ADLs:  Uses rolling walker 100% of time due to unsteadiness; quit  driving in 5409; no cooking; house cleaner and washes clothes CNA; CNA helps with dressing, bathing; CNA five days per week 2-4 hours per day.  CNA grocery shopping; pays bills.        Advanced Directives:  HCPOA:  Sister-in-law Lexiana Bi [phone]).  +living will --- FULL CODE but no prolonged measures.           Family History  Problem Relation Age of Onset  . Heart disease Father 33    CAD/CABG  . Hypertension Father   . Hyperlipidemia Father   . Heart disease Brother 35    CABG  . Arthritis Brother   . Hyperlipidemia Brother   . Hypertension Brother   . Tuberculosis Maternal Grandmother   . Heart disease Maternal Grandfather   . Heart disease Paternal Grandmother   . Cancer Paternal Grandfather   . Mental illness Mother   . Hypertension Mother   . Gout Mother   . Hypertension Brother        Objective:    BP 93/60 mmHg  Pulse 96  Temp(Src) 98.3 F (36.8 C) (Oral)  Resp 16  Ht 5\' 6"  (1.676 m)  Wt 180 lb (81.647 kg)  BMI 29.07 kg/m2  SpO2 97% Physical Exam  Constitutional: She is oriented to person, place, and time. She appears well-developed and well-nourished. No distress.  HENT:  Head: Normocephalic and atraumatic.  Right Ear: External ear normal.  Left Ear: External ear normal.  Nose: Nose normal.  Mouth/Throat: Oropharynx is clear and moist.  Eyes: Conjunctivae and EOM are normal. Pupils are equal, round, and reactive to light.  Neck: Normal range of motion. Neck supple. Carotid bruit is not present. No thyromegaly present.  Cardiovascular: Normal rate, regular rhythm, normal heart sounds and intact distal pulses.  Exam reveals no gallop and no friction rub.   No murmur heard. Pulmonary/Chest: Effort normal and breath sounds normal. She has no wheezes. She has no rales.  Abdominal: Soft. Bowel sounds are normal. She exhibits no distension and no mass. There is no tenderness. There is no rebound and no guarding.  Musculoskeletal: She exhibits edema.    Lymphadenopathy:    She has no cervical adenopathy.  Neurological: She is alert and oriented to person, place, and time. No cranial nerve deficit.  Skin: Skin is warm and dry. Rash noted. She is not diaphoretic. There is erythema. No pallor.     RLE with diffuse scaling and denuded skin; mild erythema; no streaking; minimal warmth; 1+ pitting edema to RLE; R feet with cyanosis of toes.   Psychiatric: She has a normal mood and affect. Her behavior is normal.        Assessment & Plan:   1. Essential hypertension   2. Hypothyroidism due to acquired atrophy of thyroid   3. Raynaud's disease   4. Lower extremity venous stasis   5. Generalized anxiety disorder   6. Slow transit constipation   7. Pure hypercholesterolemia  8. Vitamin D deficiency   9. Cellulitis of leg, right     Orders Placed This Encounter  Procedures  . CBC with Differential/Platelet  . Comprehensive metabolic panel    Order Specific Question:  Has the patient fasted?    Answer:  Yes  . Lipid panel    Order Specific Question:  Has the patient fasted?    Answer:  Yes  . TSH  . T4, free  . VITAMIN D 25 Hydroxy (Vit-D Deficiency, Fractures)   Meds ordered this encounter  Medications  . spironolactone (ALDACTONE) 25 MG tablet    Sig: Take 0.5 tablets (12.5 mg total) by mouth daily.    Dispense:  90 tablet    Refill:  0  . FLUoxetine (PROZAC) 20 MG tablet    Sig: Take 1 tablet (20 mg total) by mouth daily. For first month; then increase to two daily.    Dispense:  180 tablet    Refill:  1  . venlafaxine XR (EFFEXOR-XR) 75 MG 24 hr capsule    Sig: TAKE 1 CAPSULE (75 MG TOTAL) BY MOUTH DAILY.    Dispense:  30 capsule    Refill:  0  . ketoconazole (NIZORAL) 2 % cream    Sig: Apply 1 application topically 2 (two) times daily.    Dispense:  60 g    Refill:  2  . doxycycline (VIBRAMYCIN) 100 MG capsule    Sig: Take 1 capsule (100 mg total) by mouth 2 (two) times daily.    Dispense:  20 capsule     Refill:  0  . lisinopril (PRINIVIL,ZESTRIL) 20 MG tablet    Sig: TAKE 1 TABLET (20 MG TOTAL) BY MOUTH DAILY.    Dispense:  90 tablet    Refill:  1    Return in about 6 months (around 07/14/2016) for complete physical examiniation.    Kristi Paulita Fujita, M.D. [facility] [address] [place], [place]  [address] [phone] phone [phone] fax

## 2016-01-13 NOTE — Patient Instructions (Addendum)
1.  Decrease Spironolactone 25mg  to 1/2 tablet daily. 2.  Starting Prozac/Fluoxetine 20mg  one daily; we are decreasing Effexor to 75mg  daily for one month. 3. Next month, increase Prozac to 2 tablets daily and STOP Effexor.      IF you received an x-ray today, you will receive an invoice from [facility]. Please contact [facility] at [phone] with questions or concerns regarding your invoice.   IF you received labwork today, you will receive an invoice from [facility]. Please contact [facility] at [phone] with questions or concerns regarding your invoice.   Our billing staff will not be able to assist you with questions regarding bills from these companies.  You will be contacted with the lab results as soon as they are available. The fastest way to get your results is to activate your My Chart account. Instructions are located on the last page of this paperwork. If you have not heard from [phone] regarding the results in 2 weeks, please contact this office.    We recommend that you schedule a mammogram for breast cancer screening. Typically, you do not need a referral to do this. Please contact a local imaging center to schedule your mammogram.  [facility] - [phone]  *ask for the Radiology Department [facility]) - [phone] or [phone]  [facility] - [phone] [facility] - [phone] [facility] [phone] - [phone]  *ask for the Radiology Department [facility] - [phone]  *ask for the Radiology Department [facility] - [phone]  *ask for the Mammography Department [facility] - [phone]

## 2016-01-14 ENCOUNTER — Telehealth: Payer: Self-pay

## 2016-01-14 LAB — COMPREHENSIVE METABOLIC PANEL
ALT: 13 U/L (ref 6–29)
AST: 20 U/L (ref 10–35)
Albumin: 4.3 g/dL (ref 3.6–5.1)
Alkaline Phosphatase: 91 U/L (ref 33–130)
BUN: 35 mg/dL — ABNORMAL HIGH (ref 7–25)
CALCIUM: 9.4 mg/dL (ref 8.6–10.4)
CHLORIDE: 102 mmol/L (ref 98–110)
CO2: 21 mmol/L (ref 20–31)
Creat: 2.19 mg/dL — ABNORMAL HIGH (ref 0.50–0.99)
GLUCOSE: 84 mg/dL (ref 65–99)
Potassium: 6.3 mmol/L (ref 3.5–5.3)
Sodium: 135 mmol/L (ref 135–146)
Total Bilirubin: 0.4 mg/dL (ref 0.2–1.2)
Total Protein: 7.4 g/dL (ref 6.1–8.1)

## 2016-01-14 LAB — LIPID PANEL
CHOL/HDL RATIO: 2.4 ratio (ref <=5.0)
Cholesterol: 164 mg/dL (ref 125–200)
HDL: 68 mg/dL (ref >=46)
LDL Cholesterol: 80 mg/dL (ref <130)
Triglycerides: 80 mg/dL (ref <150)
VLDL: 16 mg/dL (ref <30)

## 2016-01-14 LAB — VITAMIN D 25 HYDROXY (VIT D DEFICIENCY, FRACTURES): VIT D 25 HYDROXY: 28 ng/mL — AB (ref 30–100)

## 2016-01-14 LAB — TSH: TSH: 4.75 m[IU]/L — AB

## 2016-01-14 LAB — T4, FREE: FREE T4: 1.2 ng/dL (ref 0.8–1.8)

## 2016-01-14 NOTE — Telephone Encounter (Signed)
Pt potassium was 6.3 critical high, no visible hemolysis. Results were repeated and verified.  [facility]  [phone]

## 2016-01-16 ENCOUNTER — Other Ambulatory Visit (INDEPENDENT_AMBULATORY_CARE_PROVIDER_SITE_OTHER): Payer: Medicare HMO | Admitting: Family Medicine

## 2016-01-16 DIAGNOSIS — E875 Hyperkalemia: Secondary | ICD-10-CM | POA: Diagnosis not present

## 2016-01-16 LAB — COMPREHENSIVE METABOLIC PANEL
ALT: 13 U/L (ref 6–29)
AST: 18 U/L (ref 10–35)
Albumin: 4 g/dL (ref 3.6–5.1)
Alkaline Phosphatase: 89 U/L (ref 33–130)
BUN: 28 mg/dL — AB (ref 7–25)
CHLORIDE: 104 mmol/L (ref 98–110)
CO2: 20 mmol/L (ref 20–31)
CREATININE: 1.7 mg/dL — AB (ref 0.50–0.99)
Calcium: 8.9 mg/dL (ref 8.6–10.4)
GLUCOSE: 95 mg/dL (ref 65–99)
Potassium: 5 mmol/L (ref 3.5–5.3)
SODIUM: 137 mmol/L (ref 135–146)
TOTAL PROTEIN: 7 g/dL (ref 6.1–8.1)
Total Bilirubin: 0.3 mg/dL (ref 0.2–1.2)

## 2016-01-16 NOTE — Progress Notes (Signed)
Pt. arrived for stat repeat K+ level per Dr. [name] CMP ordered Will educate on increased water intake

## 2016-01-16 NOTE — Addendum Note (Signed)
Addended by: Thelma Barge D on: 01/16/2016 06:18 PM   Modules accepted: Orders

## 2016-01-16 NOTE — Telephone Encounter (Signed)
Spoke with patient --- advised of potassium level elevated at 6.3.  Creatinine 2.19 as well.  Patient admits to decreased fluid intake lately; feeling well other than anxiety.  Redness on leg unchanged; applying cream to leg.   Hyperkalemia and acute renal failure:  Recommend immediate lab draw; recommend water intake.

## 2016-01-19 ENCOUNTER — Telehealth: Payer: Self-pay

## 2016-01-19 ENCOUNTER — Ambulatory Visit: Payer: Medicare HMO | Admitting: Family Medicine

## 2016-01-19 NOTE — Telephone Encounter (Signed)
Pt. Sister called concerned for her sister's repeat lab results she stated she had to have a repeat lab because her potassium was elevated and she wanted to know the new lab results

## 2016-01-20 NOTE — Telephone Encounter (Signed)
Call sister--- I spoke with patient on Friday night; her potassium level was normal with repeat labs.  I advised patient to restart Lisinopril and Spironolactone.  Kidney function was also improved.

## 2016-01-22 NOTE — Telephone Encounter (Signed)
Dr. [name]  They want to know if they should decrease the amount of potassium in her diet since it was elevated in that one lab

## 2016-01-23 NOTE — Telephone Encounter (Signed)
No; the family does not need to decrease to potassium in patient's diet.  Patient should make sure to drink plenty of water throughout the day. At her recent visit, I decreased her Spironolactone dose and this should help with potassium level.

## 2016-01-24 NOTE — Telephone Encounter (Signed)
Unable to reach pt. Will try to call back

## 2016-01-27 NOTE — Telephone Encounter (Signed)
Unable to reach.

## 2016-01-29 ENCOUNTER — Other Ambulatory Visit: Payer: Self-pay | Admitting: Family Medicine

## 2016-02-03 ENCOUNTER — Other Ambulatory Visit: Payer: Self-pay

## 2016-02-04 NOTE — Telephone Encounter (Signed)
Refill approved by [facility]; Klonopin o.5mg  refilled x 90  Sig: take one tablet 3 x daily as needed

## 2016-02-13 ENCOUNTER — Telehealth: Payer: Self-pay

## 2016-02-13 DIAGNOSIS — F411 Generalized anxiety disorder: Secondary | ICD-10-CM

## 2016-02-13 NOTE — Telephone Encounter (Signed)
Dois Davenport, the patient's CNA, says that the patient is having withdrawls after changing the dosage of effexor.  She says she is feeling really bad and can not come in.  Please Dois Davenport asap at [phone]

## 2016-02-14 NOTE — Telephone Encounter (Signed)
Dr. [name]   CNA Dois Davenport wanted you to have her home number [phone]

## 2016-02-16 NOTE — Telephone Encounter (Signed)
Routed to Dr. Smith  

## 2016-02-26 NOTE — Telephone Encounter (Signed)
Patient's CNA is calling because they haven't heard anything back.

## 2016-03-02 MED ORDER — VENLAFAXINE HCL ER 75 MG PO CP24: ORAL_CAPSULE | ORAL | 1 refills | Status: DC

## 2016-03-02 NOTE — Telephone Encounter (Signed)
Sandra advised

## 2016-03-02 NOTE — Telephone Encounter (Signed)
Meds ordered this encounter  Medications  . venlafaxine XR (EFFEXOR-XR) 75 MG 24 hr capsule    Sig: TAKE 1 CAPSULE (75 MG TOTAL) BY MOUTH DAILY.    Dispense:  90 capsule    Refill:  1    Order Specific Question:   Supervising Provider    Answer:   Clelia Croft, EVA N [[id number]]

## 2016-03-02 NOTE — Telephone Encounter (Signed)
Patient's CNA is calling because she hasn't heard anything in a month. She wants to know is patient can get a prescription for effexor and to see if any other provider can approve it. Please advise!  [phone]

## 2016-03-10 ENCOUNTER — Other Ambulatory Visit: Payer: Self-pay | Admitting: Family Medicine

## 2016-03-10 DIAGNOSIS — I10 Essential (primary) hypertension: Secondary | ICD-10-CM

## 2016-03-10 DIAGNOSIS — I878 Other specified disorders of veins: Secondary | ICD-10-CM

## 2016-03-10 MED ORDER — SPIRONOLACTONE 25 MG PO TABS: 12.5000 mg | ORAL_TABLET | Freq: Every day | ORAL | 0 refills | Status: DC

## 2016-06-28 ENCOUNTER — Telehealth: Payer: Self-pay

## 2016-06-28 NOTE — Telephone Encounter (Signed)
Patients CNA Christy Luna called in wanting to see if Dr [name] would call patient in some predisone for her foot, states that she has anxiety and when she is real nervous all she does is scratch her foot till it bleeds. She has an appointment with a dermatologist on 11/28 but she needs something to help her make it to that appointment.  Her CNA also stated that she believes Christy Luna is bi-polar and needs something different than venlafaxine XR (EFFEXOR-XR) 75 MG 24 hr capsule. And states that the patient will not go see a physiatrist.  She would like to have Dr [name] call her back at [phone]. She is on her HIPAA

## 2016-06-29 NOTE — Telephone Encounter (Signed)
Spoke to [place] and she states that the patient is "picking her foot until it bleeds" Patient has been on prednisone for this before and it seems to have helped this.  She is also concerned that the patient may be bi-polar or schizophrenic.  I told her that she would have to evaluated in the office before those issues could be addressed.  She stated that she understands that, but further states patient's klonopin is not working and wonders if it could be changed to xanax.  Told her that I would send the message to Dr. [name] and we will call her back with information.  She was fine with this.

## 2016-06-30 MED ORDER — CLOBETASOL PROPIONATE 0.05 % EX CREA
1.0000 | TOPICAL_CREAM | Freq: Two times a day (BID) | CUTANEOUS | 0 refills | Status: DC
Start: 2016-06-30 — End: 2017-02-01

## 2016-06-30 NOTE — Telephone Encounter (Signed)
Call --- I greatly appreciate caregiver touching base with me.  Pt is actually due for six month follow-up.  I am not willing to prescribe Prednisone without evaluating patient's foot. I can send in a topical steroid to apply until patient can be seen.  I cannot adjust medications over the phone for anxiety/bipolar/etc.  Please have patient schedule OV with me in upcoming few weeks.  I have sent in Clobetasol cream to pharmacy to apply to the foot.

## 2016-07-05 NOTE — Telephone Encounter (Signed)
Spoke with Christy Luna and informed her of clobetasol cream. She stated that she has already made an appt with you on Thursday 11/30

## 2016-07-08 ENCOUNTER — Ambulatory Visit: Payer: Medicare HMO

## 2016-07-21 ENCOUNTER — Encounter: Payer: Self-pay | Admitting: Family Medicine

## 2016-07-21 ENCOUNTER — Ambulatory Visit (INDEPENDENT_AMBULATORY_CARE_PROVIDER_SITE_OTHER): Payer: Medicare HMO | Admitting: Family Medicine

## 2016-07-21 VITALS — BP 108/64 | HR 88 | Temp 97.6°F | Resp 18 | Ht 66.0 in

## 2016-07-21 DIAGNOSIS — I7300 Raynaud's syndrome without gangrene: Secondary | ICD-10-CM | POA: Diagnosis not present

## 2016-07-21 DIAGNOSIS — K5901 Slow transit constipation: Secondary | ICD-10-CM

## 2016-07-21 DIAGNOSIS — I878 Other specified disorders of veins: Secondary | ICD-10-CM | POA: Diagnosis not present

## 2016-07-21 DIAGNOSIS — I10 Essential (primary) hypertension: Secondary | ICD-10-CM

## 2016-07-21 DIAGNOSIS — L309 Dermatitis, unspecified: Secondary | ICD-10-CM

## 2016-07-21 DIAGNOSIS — E7800 Pure hypercholesterolemia, unspecified: Secondary | ICD-10-CM | POA: Diagnosis not present

## 2016-07-21 DIAGNOSIS — R69 Illness, unspecified: Secondary | ICD-10-CM | POA: Diagnosis not present

## 2016-07-21 DIAGNOSIS — Z0000 Encounter for general adult medical examination without abnormal findings: Secondary | ICD-10-CM | POA: Diagnosis not present

## 2016-07-21 DIAGNOSIS — E034 Atrophy of thyroid (acquired): Secondary | ICD-10-CM | POA: Diagnosis not present

## 2016-07-21 DIAGNOSIS — F411 Generalized anxiety disorder: Secondary | ICD-10-CM

## 2016-07-21 MED ORDER — PAROXETINE HCL 10 MG PO TABS: 10.0000 mg | ORAL_TABLET | Freq: Two times a day (BID) | ORAL | 1 refills | Status: DC

## 2016-07-21 NOTE — Patient Instructions (Addendum)
1.  Apply vaseline to legs once per day.    IF you received an x-ray today, you will receive an invoice from [facility]. Please contact [facility] at [phone] with questions or concerns regarding your invoice.   IF you received labwork today, you will receive an invoice from [employer]. Please contact [facility] at [phone] with questions or concerns regarding your invoice.   Our billing staff will not be able to assist you with questions regarding bills from these companies.  You will be contacted with the lab results as soon as they are available. The fastest way to get your results is to activate your My Chart account. Instructions are located on the last page of this paperwork. If you have not heard from [place] regarding the results in 2 weeks, please contact this office.

## 2016-07-21 NOTE — Progress Notes (Signed)
Subjective:    Patient ID: Christy Luna, female    DOB: Jan 06, 1946, 70 y.o.   MRN: [id number]  07/21/2016  Annual Exam; Anxiety; and Other (foot issue, right)   HPI This 70 y.o. female presents for Annual Wellness Examination.  Last physical:07-21-2015 Christy Luna Pap smear: menopause age 52. Mammogram: REFUSES Colonoscopy: Dr. Loreta Luna did not feel appropriate; performs yearly stool cards. Last cards 08-21-2015 negative. Bone density: never Eye exam: Dental exam:   There is no immunization history on file for this patient. BP Readings from Last 3 Encounters:  07/21/16 108/64  01/13/16 93/60  07/21/15 117/73   Wt Readings from Last 3 Encounters:  01/13/16 180 lb (81.6 kg)  07/21/15 163 lb 6.4 oz (74.1 kg)  10/08/14 176 lb 3.2 oz (79.9 kg)    Dermatitis: soap and water qod; has tried a tons of creams; triamcinolone, ammonia lactate, clobetaosol.  Prescribed doxycycline and prednisone worked last visit six months ago.   Anxiety: Prozac caused diarrhea; taking Effexor ER 75mg  daily and not effective; anxiety is out the roof.  Klonopin not working; has been taking it forever.  No side effects to Effexor.  Sleeping until 2:30pm; wants to take medication and then up all night.  Does not go to bed until really late.  Took Prozac for three weeks.   Was previously taking Effexor 150mg  daily; now anxiety much worse.  Taking Klonopin two times daily.  No Zoloft, Lexapro, or Paxil. Does not want to do Zoloft.   Zoloft not effective.  Previous Zoloft per patient.     HTN: Lisinopril one and Spironolactone 1/2 daily.   Hyperlipidemia: Patient reports good compliance with medication, good tolerance to medication, and good symptom control.    Hypothyroidism: Patient reports good compliance with medication, good tolerance to medication, and good symptom control.    Review of Systems  Constitutional: Negative for activity change, appetite change, chills, diaphoresis, fatigue, fever and unexpected  weight change.  HENT: Negative for congestion, dental problem, drooling, ear discharge, ear pain, facial swelling, hearing loss, mouth sores, nosebleeds, postnasal drip, rhinorrhea, sinus pressure, sneezing, sore throat, tinnitus, trouble swallowing and voice change.   Eyes: Negative for photophobia, pain, discharge, redness, itching and visual disturbance.  Respiratory: Negative for apnea, cough, choking, chest tightness, shortness of breath, wheezing and stridor.   Cardiovascular: Negative for chest pain, palpitations and leg swelling.  Gastrointestinal: Negative for abdominal distention, abdominal pain, anal bleeding, blood in stool, constipation, diarrhea, nausea, rectal pain and vomiting.  Endocrine: Negative for cold intolerance, heat intolerance, polydipsia, polyphagia and polyuria.  Genitourinary: Negative for decreased urine volume, difficulty urinating, dyspareunia, dysuria, enuresis, flank pain, frequency, genital sores, hematuria, menstrual problem, pelvic pain, urgency, vaginal bleeding, vaginal discharge and vaginal pain.  Musculoskeletal: Negative for arthralgias, back pain, gait problem, joint swelling, myalgias, neck pain and neck stiffness.  Skin: Positive for rash. Negative for color change, pallor and wound.  Allergic/Immunologic: Negative for environmental allergies, food allergies and immunocompromised state.  Neurological: Negative for dizziness, tremors, seizures, syncope, facial asymmetry, speech difficulty, weakness, light-headedness, numbness and headaches.  Hematological: Negative for adenopathy. Does not bruise/bleed easily.  Psychiatric/Behavioral: Negative for agitation, behavioral problems, confusion, decreased concentration, dysphoric mood, hallucinations, self-injury, sleep disturbance and suicidal ideas. The patient is nervous/anxious. The patient is not hyperactive.     Past Medical History:  Diagnosis Date  . Anxiety   . Arthritis    DDD lumbar  . Depression    . Hypertension   . Thyroid disease  No past surgical history on file. Allergies  Allergen Reactions  . Augmentin [Amoxicillin-Pot Clavulanate]   . Sulfa Drugs Cross Reactors     Social History   Social History  . Marital status: Single    Spouse name: N/A  . Number of children: N/A  . Years of education: N/A   Occupational History  . Not on file.   Social History Main Topics  . Smoking status: Never Smoker  . Smokeless tobacco: Not on file  . Alcohol use No  . Drug use: No  . Sexual activity: Not on file   Other Topics Concern  . Not on file   Social History Narrative   Marital status: single; not dating; never married      Children: none      Lives: alone in house; two brothers in [place].  Caregiver daily 3 hours per day (2:00-7:00)      Employment:  Retired/unemployed;      Tobacco:  Never      Alcohol:  None      Drugs: none      Exercise:  None      ADLs:  Uses rolling walker 100% of time due to unsteadiness; quit driving in 2831; no cooking; house cleaner and washes clothes CNA; CNA helps with dressing, bathing; CNA five days per week 2-4 hours per day.  CNA grocery shopping; pays bills.        Advanced Directives:  HCPOA:  Sister-in-law Christy Luna [phone]).  +living will --- FULL CODE but no prolonged measures.           Family History  Problem Relation Age of Onset  . Heart disease Father 16    CAD/CABG  . Hypertension Father   . Hyperlipidemia Father   . Heart disease Brother 21    CABG  . Arthritis Brother   . Hyperlipidemia Brother   . Hypertension Brother   . Tuberculosis Maternal Grandmother   . Heart disease Maternal Grandfather   . Heart disease Paternal Grandmother   . Cancer Paternal Grandfather   . Mental illness Mother   . Hypertension Mother   . Gout Mother   . Hypertension Brother        Objective:    BP 108/64 (BP Location: Left Arm, Patient Position: Sitting, Cuff Size: Normal)   Pulse 88   Temp 97.6 F (36.4 C)    Resp 18   Ht 5\' 6"  (1.676 m)  Physical Exam  Constitutional: She is oriented to person, place, and time. She appears well-developed and well-nourished. No distress.  HENT:  Head: Normocephalic and atraumatic.  Right Ear: External ear normal.  Left Ear: External ear normal.  Nose: Nose normal.  Mouth/Throat: Oropharynx is clear and moist.  Eyes: Conjunctivae and EOM are normal. Pupils are equal, round, and reactive to light.  Neck: Normal range of motion and full passive range of motion without pain. Neck supple. No JVD present. Carotid bruit is not present. No thyromegaly present.  Cardiovascular: Normal rate, regular rhythm and normal heart sounds.  Exam reveals no gallop and no friction rub.   No murmur heard. Pulmonary/Chest: Effort normal and breath sounds normal. She has no wheezes. She has no rales.  Abdominal: Soft. Bowel sounds are normal. She exhibits no distension and no mass. There is no tenderness. There is no rebound and no guarding.  Musculoskeletal:       Right shoulder: Normal.       Left shoulder: Normal.  Cervical back: Normal.  Lymphadenopathy:    She has no cervical adenopathy.  Neurological: She is alert and oriented to person, place, and time. She has normal reflexes. No cranial nerve deficit. She exhibits normal muscle tone. Coordination normal.  Skin: Skin is warm and dry. Rash noted. She is not diaphoretic. No erythema. No pallor.  Diffuse scaling rash with excoriations lateral distal calf and lateral foot; minimal erythema; no drainage; no streaking; non-tender.  Psychiatric: She has a normal mood and affect. Her behavior is normal. Judgment and thought content normal.  Nursing note and vitals reviewed.  Results for orders placed or performed in visit on 07/21/16  CBC with Differential/Platelet  Result Value Ref Range   WBC 8.1 3.4 - 10.8 x10E3/uL   RBC 3.76 (L) 3.77 - 5.28 x10E6/uL   Hemoglobin 9.8 (L) 11.1 - 15.9 g/dL   Hematocrit 76.7 (L) 01.1 -  46.6 %   MCV 81 79 - 97 fL   MCH 26.1 (L) 26.6 - 33.0 pg   MCHC 32.0 31.5 - 35.7 g/dL   RDW 00.3 49.6 - 11.6 %   Platelets 338 150 - 379 x10E3/uL   Neutrophils 68 Not Estab. %   Lymphs 19 Not Estab. %   Monocytes 9 Not Estab. %   Eos 3 Not Estab. %   Basos 1 Not Estab. %   Neutrophils Absolute 5.4 1.4 - 7.0 x10E3/uL   Lymphocytes Absolute 1.6 0.7 - 3.1 x10E3/uL   Monocytes Absolute 0.8 0.1 - 0.9 x10E3/uL   EOS (ABSOLUTE) 0.3 0.0 - 0.4 x10E3/uL   Basophils Absolute 0.1 0.0 - 0.2 x10E3/uL   Immature Granulocytes 0 Not Estab. %   Immature Grans (Abs) 0.0 0.0 - 0.1 x10E3/uL  Comprehensive metabolic panel  Result Value Ref Range   Glucose 89 65 - 99 mg/dL   BUN 39 (H) 8 - 27 mg/dL   Creatinine, Ser 4.35 (H) 0.57 - 1.00 mg/dL   GFR calc non Af Amer 38 (L) >59 mL/min/1.73   GFR calc Af Amer 44 (L) >59 mL/min/1.73   BUN/Creatinine Ratio 27 12 - 28   Sodium 142 134 - 144 mmol/L   Potassium 5.1 3.5 - 5.2 mmol/L   Chloride 103 96 - 106 mmol/L   CO2 19 18 - 29 mmol/L   Calcium 9.0 8.7 - 10.3 mg/dL   Total Protein 6.5 6.0 - 8.5 g/dL   Albumin 4.0 3.6 - 4.8 g/dL   Globulin, Total 2.5 1.5 - 4.5 g/dL   Albumin/Globulin Ratio 1.6 1.2 - 2.2   Bilirubin Total 0.4 0.0 - 1.2 mg/dL   Alkaline Phosphatase 80 39 - 117 IU/L   AST 17 0 - 40 IU/L   ALT 14 0 - 32 IU/L  Lipid panel  Result Value Ref Range   Cholesterol, Total 170 100 - 199 mg/dL   Triglycerides 67 0 - 149 mg/dL   HDL 62 >39 mg/dL   VLDL Cholesterol Cal 13 5 - 40 mg/dL   LDL Calculated 95 0 - 99 mg/dL   Chol/HDL Ratio 2.7 0.0 - 4.4 ratio units  T4, free  Result Value Ref Range   Free T4 1.15 0.82 - 1.77 ng/dL  TSH  Result Value Ref Range   TSH 4.740 (H) 0.450 - 4.500 uIU/mL   Fall Risk  07/21/2016 01/13/2016 07/21/2015 10/08/2014 04/04/2014  Falls in the past year? Yes Yes No No Yes  Number falls in past yr: - 2 or more - - 2 or more  Injury with  Fall? - No - - -  Risk for fall due to : - - - - -   Depression screen [facility] 2/9  07/21/2016 01/13/2016 10/08/2014 04/04/2014 10/26/2013  Decreased Interest 0 3 0 (No Data) 0  Down, Depressed, Hopeless 1 0 0 0 2  PHQ - 2 Score 1 3 0 0 2  Altered sleeping - 3 - - 0  Tired, decreased energy - 3 - - 0  Change in appetite - 3 - - 0  Feeling bad or failure about yourself  - 3 - - 0  Trouble concentrating - 3 - - 0  Moving slowly or fidgety/restless - 3 - - 0  Suicidal thoughts - 0 - - 0  PHQ-9 Score - 21 - - 2   Functional Status Survey: Is the patient deaf or have difficulty hearing?: No Does the patient have difficulty seeing, even when wearing glasses/contacts?: No Does the patient have difficulty concentrating, remembering, or making decisions?: No Does the patient have difficulty walking or climbing stairs?: No Does the patient have difficulty dressing or bathing?: Yes Does the patient have difficulty doing errands alone such as visiting a doctor's office or shopping?: Yes     Assessment & Plan:   1. Encounter for Medicare annual wellness exam   2. Essential hypertension   3. Raynaud's disease without gangrene   4. Slow transit constipation   5. Hypothyroidism due to acquired atrophy of thyroid   6. Dermatitis   7. Generalized anxiety disorder   8. Lower extremity venous stasis   9. Pure hypercholesterolemia    -start Paxil 10mg  daily in addition to Effexor -apply Vaseline to RLE lateral leg.   Orders Placed This Encounter  Procedures  . CBC with Differential/Platelet  . Comprehensive metabolic panel    Order Specific Question:   Has the patient fasted?    Answer:   Yes  . Lipid panel    Order Specific Question:   Has the patient fasted?    Answer:   Yes  . T4, free  . TSH  . EKG 12-Lead   Meds ordered this encounter  Medications  . PARoxetine (PAXIL) 10 MG tablet    Sig: Take 1 tablet (10 mg total) by mouth 2 (two) times daily.    Dispense:  180 tablet    Refill:  1    Return in about 6 months (around 01/19/2017) for recheck.   Kristi 01/21/2017, M.D. [facility] [address] [place], [place]  [place] [phone] phone [phone] fax

## 2016-07-22 LAB — CBC WITH DIFFERENTIAL/PLATELET
BASOS: 1 %
Basophils Absolute: 0.1 10*3/uL (ref 0.0–0.2)
EOS (ABSOLUTE): 0.3 10*3/uL (ref 0.0–0.4)
EOS: 3 %
HEMATOCRIT: 30.6 % — AB (ref 34.0–46.6)
HEMOGLOBIN: 9.8 g/dL — AB (ref 11.1–15.9)
Immature Grans (Abs): 0 10*3/uL (ref 0.0–0.1)
Immature Granulocytes: 0 %
LYMPHS ABS: 1.6 10*3/uL (ref 0.7–3.1)
Lymphs: 19 %
MCH: 26.1 pg — AB (ref 26.6–33.0)
MCHC: 32 g/dL (ref 31.5–35.7)
MCV: 81 fL (ref 79–97)
MONOCYTES: 9 %
Monocytes Absolute: 0.8 10*3/uL (ref 0.1–0.9)
NEUTROS ABS: 5.4 10*3/uL (ref 1.4–7.0)
Neutrophils: 68 %
Platelets: 338 10*3/uL (ref 150–379)
RBC: 3.76 x10E6/uL — ABNORMAL LOW (ref 3.77–5.28)
RDW: 15.3 % (ref 12.3–15.4)
WBC: 8.1 10*3/uL (ref 3.4–10.8)

## 2016-07-22 LAB — COMPREHENSIVE METABOLIC PANEL
ALBUMIN: 4 g/dL (ref 3.6–4.8)
ALK PHOS: 80 IU/L (ref 39–117)
ALT: 14 IU/L (ref 0–32)
AST: 17 IU/L (ref 0–40)
Albumin/Globulin Ratio: 1.6 (ref 1.2–2.2)
BILIRUBIN TOTAL: 0.4 mg/dL (ref 0.0–1.2)
BUN / CREAT RATIO: 27 (ref 12–28)
BUN: 39 mg/dL — ABNORMAL HIGH (ref 8–27)
CO2: 19 mmol/L (ref 18–29)
CREATININE: 1.42 mg/dL — AB (ref 0.57–1.00)
Calcium: 9 mg/dL (ref 8.7–10.3)
Chloride: 103 mmol/L (ref 96–106)
GFR calc non Af Amer: 38 mL/min/{1.73_m2} — ABNORMAL LOW (ref >59)
GFR, EST AFRICAN AMERICAN: 44 mL/min/{1.73_m2} — AB (ref >59)
GLOBULIN, TOTAL: 2.5 g/dL (ref 1.5–4.5)
Glucose: 89 mg/dL (ref 65–99)
Potassium: 5.1 mmol/L (ref 3.5–5.2)
SODIUM: 142 mmol/L (ref 134–144)
TOTAL PROTEIN: 6.5 g/dL (ref 6.0–8.5)

## 2016-07-22 LAB — T4, FREE: FREE T4: 1.15 ng/dL (ref 0.82–1.77)

## 2016-07-22 LAB — LIPID PANEL
CHOLESTEROL TOTAL: 170 mg/dL (ref 100–199)
Chol/HDL Ratio: 2.7 ratio units (ref 0.0–4.4)
HDL: 62 mg/dL (ref >39)
LDL CALC: 95 mg/dL (ref 0–99)
Triglycerides: 67 mg/dL (ref 0–149)
VLDL Cholesterol Cal: 13 mg/dL (ref 5–40)

## 2016-07-22 LAB — TSH: TSH: 4.74 u[IU]/mL — AB (ref 0.450–4.500)

## 2016-07-26 ENCOUNTER — Telehealth: Payer: Self-pay | Admitting: Family Medicine

## 2016-07-26 DIAGNOSIS — D638 Anemia in other chronic diseases classified elsewhere: Secondary | ICD-10-CM

## 2016-07-26 DIAGNOSIS — N182 Chronic kidney disease, stage 2 (mild): Secondary | ICD-10-CM

## 2016-07-26 NOTE — Telephone Encounter (Signed)
Dr [name] pt calling about labs and would like for you to care her care giver Dois Davenport at [phone]

## 2016-07-29 NOTE — Telephone Encounter (Signed)
Spoke with Christy Luna/CNA and caregiver --- 1. Cholesterol under good control.  2.  Creatinine still elevated but has improved to 1.42.  3. Hemoglobin has dropped from 11.2 to 9.8.  Denies bloody stools or melena.  CNA reports that one night noticed large bowel movement with blood; thought bleeding from foot excoriations.  4. Sleeps half the day; CNA must call to wake her up. Then takes medicine late in the evening. 5.  TSH slightly abnormal; non-compliant with medication.  A/P: New onset anemia: RTC 1-2 weeks for repeat H/H.   No previous colonoscopy.  Really an ordeal to undergo prep and procedure with current mental status.

## 2016-09-06 ENCOUNTER — Other Ambulatory Visit: Payer: Self-pay | Admitting: Family Medicine

## 2016-09-06 ENCOUNTER — Other Ambulatory Visit: Payer: Self-pay | Admitting: Physician Assistant

## 2016-09-06 DIAGNOSIS — I10 Essential (primary) hypertension: Secondary | ICD-10-CM

## 2016-09-06 DIAGNOSIS — F411 Generalized anxiety disorder: Secondary | ICD-10-CM

## 2016-09-06 NOTE — Telephone Encounter (Signed)
Patient notified via My Chart.  Meds ordered this encounter  Medications  . venlafaxine XR (EFFEXOR-XR) 75 MG 24 hr capsule    Sig: TAKE ONE CAPSULE BY MOUTH ONCE DAILY    Dispense:  90 capsule    Refill:  1

## 2016-09-06 NOTE — Telephone Encounter (Signed)
Meds ordered this encounter  Medications  . lisinopril (PRINIVIL,ZESTRIL) 20 MG tablet    Sig: TAKE ONE TABLET BY MOUTH ONCE DAILY    Dispense:  90 tablet    Refill:  1   Patient notified via My Chart.

## 2016-09-09 ENCOUNTER — Telehealth: Payer: Self-pay

## 2016-09-09 NOTE — Telephone Encounter (Signed)
Calling for pt, states that the efexor was called in wrong. It was called in for 90 and should only be 60.

## 2016-09-10 ENCOUNTER — Other Ambulatory Visit: Payer: Self-pay | Admitting: Family Medicine

## 2016-09-10 DIAGNOSIS — F411 Generalized anxiety disorder: Secondary | ICD-10-CM

## 2016-10-04 ENCOUNTER — Telehealth: Payer: Self-pay

## 2016-10-04 DIAGNOSIS — F411 Generalized anxiety disorder: Secondary | ICD-10-CM

## 2016-10-04 NOTE — Telephone Encounter (Signed)
Last visit 07/28/16

## 2016-10-04 NOTE — Telephone Encounter (Signed)
Pt is checking on status of her refill request of klonopin  It was supposed to be faxed over to [place] on 09/27/16   Best number [phone]

## 2016-10-05 MED ORDER — CLONAZEPAM 0.5 MG PO TABS: 0.5000 mg | ORAL_TABLET | Freq: Two times a day (BID) | ORAL | 5 refills | Status: AC | PRN

## 2016-10-05 NOTE — Telephone Encounter (Signed)
Rx approved

## 2016-10-11 ENCOUNTER — Other Ambulatory Visit: Payer: Self-pay | Admitting: Family Medicine

## 2016-10-11 DIAGNOSIS — E034 Atrophy of thyroid (acquired): Secondary | ICD-10-CM

## 2017-02-01 ENCOUNTER — Encounter: Payer: Self-pay | Admitting: Family Medicine

## 2017-02-01 ENCOUNTER — Ambulatory Visit (INDEPENDENT_AMBULATORY_CARE_PROVIDER_SITE_OTHER): Payer: Medicare HMO | Admitting: Family Medicine

## 2017-02-01 VITALS — BP 123/70 | HR 91 | Temp 97.0°F | Resp 16 | Ht 65.35 in | Wt 179.0 lb

## 2017-02-01 DIAGNOSIS — R32 Unspecified urinary incontinence: Secondary | ICD-10-CM | POA: Diagnosis not present

## 2017-02-01 DIAGNOSIS — E034 Atrophy of thyroid (acquired): Secondary | ICD-10-CM

## 2017-02-01 DIAGNOSIS — N182 Chronic kidney disease, stage 2 (mild): Secondary | ICD-10-CM

## 2017-02-01 DIAGNOSIS — I872 Venous insufficiency (chronic) (peripheral): Secondary | ICD-10-CM | POA: Diagnosis not present

## 2017-02-01 DIAGNOSIS — D631 Anemia in chronic kidney disease: Secondary | ICD-10-CM | POA: Diagnosis not present

## 2017-02-01 DIAGNOSIS — F411 Generalized anxiety disorder: Secondary | ICD-10-CM

## 2017-02-01 DIAGNOSIS — R69 Illness, unspecified: Secondary | ICD-10-CM | POA: Diagnosis not present

## 2017-02-01 DIAGNOSIS — I10 Essential (primary) hypertension: Secondary | ICD-10-CM

## 2017-02-01 DIAGNOSIS — L89520 Pressure ulcer of left ankle, unstageable: Secondary | ICD-10-CM

## 2017-02-01 MED ORDER — TRAZODONE HCL 50 MG PO TABS: 50.0000 mg | ORAL_TABLET | Freq: Every evening | ORAL | 5 refills | Status: AC | PRN

## 2017-02-01 MED ORDER — CEPHALEXIN 500 MG PO CAPS: 500.0000 mg | ORAL_CAPSULE | Freq: Three times a day (TID) | ORAL | 0 refills | Status: AC

## 2017-02-01 MED ORDER — METOPROLOL SUCCINATE ER 50 MG PO TB24: 50.0000 mg | ORAL_TABLET | Freq: Every day | ORAL | 1 refills | Status: AC

## 2017-02-01 MED ORDER — VENLAFAXINE HCL ER 75 MG PO CP24: 225.0000 mg | ORAL_CAPSULE | Freq: Every day | ORAL | 1 refills | Status: DC

## 2017-02-01 NOTE — Progress Notes (Signed)
Subjective:    Patient ID: Christy Luna, female    DOB: [birth year], 71 y.o.   MRN: [id number]  HPI This 71 y.o. female presents with caregiver for six month follow-up of hypertension, anxiety, anemia, chronic renal insufficiency.  Not sleeping well at all.  Sleeping at night all the time.  Applying Neosporin on legs every day.  Not sure if nervous habit or if itching horribly.  Has been taking iron tablet one daily since last visit for new onset anemia.  No bloody stools.  Not checking blood pressure at home.  For anxiety, taking Klonopin one twice daily. Did not tolerate Paroxetine at last visit.  Tolerates Effexor well.   For a while, did not feel well. Now taking medication regularly and feeling much better.  Feels a little bit better.  Not sleeping well.  Called CNA at 1:00am. Tried: Prozac, Paxil.   Enuresis: chronic for past one year.  Afraid to ge tup in middle of night; afraid going to fall.  Last fluid intake at night   BP Readings from Last 3 Encounters:  02/01/17 123/70  07/21/16 108/64  01/13/16 93/60   Wt Readings from Last 3 Encounters:  02/01/17 179 lb (81.2 kg)  01/13/16 180 lb (81.6 kg)  07/21/15 163 lb 6.4 oz (74.1 kg)    There is no immunization history on file for this patient.    Review of Systems  Constitutional: Negative.  Negative for activity change, appetite change, chills, diaphoresis, fatigue, fever and unexpected weight change.  HENT: Negative.  Negative for congestion, dental problem, drooling, ear discharge, ear pain, facial swelling, hearing loss, mouth sores, nosebleeds, postnasal drip, rhinorrhea, sinus pressure, sneezing, sore throat, tinnitus, trouble swallowing and voice change.   Eyes: Negative.  Negative for photophobia, pain, discharge, redness, itching and visual disturbance.  Respiratory: Negative.  Negative for apnea, cough, choking, chest tightness, shortness of breath, wheezing and stridor.   Cardiovascular: Negative.  Negative for chest  pain, palpitations and leg swelling.  Gastrointestinal: Negative.  Negative for abdominal distention, abdominal pain, anal bleeding, blood in stool, constipation, diarrhea, nausea, rectal pain and vomiting.  Endocrine: Negative for cold intolerance, heat intolerance, polydipsia, polyphagia and polyuria.  Genitourinary: Positive for enuresis, frequency and urgency. Negative for decreased urine volume, difficulty urinating, dyspareunia, dysuria, flank pain, genital sores, hematuria, menstrual problem, pelvic pain, vaginal bleeding, vaginal discharge and vaginal pain.  Musculoskeletal: Negative.  Negative for arthralgias, back pain, gait problem, joint swelling, myalgias, neck pain and neck stiffness.  Skin: Positive for rash. Negative for color change, pallor and wound.  Allergic/Immunologic: Negative.  Negative for environmental allergies, food allergies and immunocompromised state.  Neurological: Negative.  Negative for dizziness, tremors, seizures, syncope, facial asymmetry, speech difficulty, weakness, light-headedness, numbness and headaches.  Hematological: Negative.  Negative for adenopathy. Does not bruise/bleed easily.  Psychiatric/Behavioral: Positive for sleep disturbance. Negative for agitation, behavioral problems, confusion, decreased concentration, dysphoric mood, hallucinations, self-injury and suicidal ideas. The patient is nervous/anxious. The patient is not hyperactive.    Past Medical History:  Diagnosis Date  . Anxiety   . Arthritis    DDD lumbar  . Depression   . Hypertension   . Thyroid disease     has no past surgical history on file.  Social History   Social History  . Marital status: Single    Spouse name: N/A  . Number of children: N/A  . Years of education: N/A   Occupational History  . Not on file.   Social History Main  Topics  . Smoking status: Never Smoker  . Smokeless tobacco: Never Used  . Alcohol use No  . Drug use: No  . Sexual activity: Not on  file   Other Topics Concern  . Not on file   Social History Narrative   Marital status: single; not dating; never married      Children: none      Lives: alone in house; two brothers in [place].  Caregiver daily 3 hours per day (2:00-7:00)      Employment:  Retired/unemployed;      Tobacco:  Never      Alcohol:  None      Drugs: none      Exercise:  None      ADLs:  Uses rolling walker 100% of time due to unsteadiness; quit driving in 8295; no cooking; house cleaner and washes clothes CNA; CNA helps with dressing, bathing; CNA five days per week 2-4 hours per day.  CNA grocery shopping; pays bills.        Advanced Directives:  HCPOA:  Sister-in-law Dajanay Bielen [phone]).  +living will --- FULL CODE but no prolonged measures.           Family History  Problem Relation Age of Onset  . Heart disease Father 40       CAD/CABG  . Hypertension Father   . Hyperlipidemia Father   . Heart disease Brother 40       CABG  . Arthritis Brother   . Hyperlipidemia Brother   . Hypertension Brother   . Tuberculosis Maternal Grandmother   . Heart disease Maternal Grandfather   . Heart disease Paternal Grandmother   . Cancer Paternal Grandfather   . Mental illness Mother   . Hypertension Mother   . Gout Mother   . Hypertension Brother          Objective:   Physical Exam  Constitutional: She is oriented to person, place, and time. She appears well-developed and well-nourished. No distress.  HENT:  Head: Normocephalic and atraumatic.  Right Ear: External ear normal.  Left Ear: External ear normal.  Nose: Nose normal.  Mouth/Throat: Oropharynx is clear and moist.  Eyes: Conjunctivae and EOM are normal. Pupils are equal, round, and reactive to light.  Neck: Normal range of motion. Neck supple. Carotid bruit is not present. No thyromegaly present.  Cardiovascular: Normal rate, regular rhythm, normal heart sounds and intact distal pulses.  Exam reveals no gallop and no friction rub.   No  murmur heard. Pulmonary/Chest: Effort normal and breath sounds normal. She has no wheezes. She has no rales.  Abdominal: Soft. Bowel sounds are normal. She exhibits no distension and no mass. There is no tenderness. There is no rebound and no guarding.  Lymphadenopathy:    She has no cervical adenopathy.  Neurological: She is alert and oriented to person, place, and time. No cranial nerve deficit.  Skin: Skin is warm and dry. No rash noted. She is not diaphoretic. No erythema. No pallor.  Excoriations along arms, distal legs; erythema diffusely B ankles; L ankle lateral with small ulcer with no active drainage; +TTP.  Skin very dry diffusely.  Psychiatric: She has a normal mood and affect. Her behavior is normal.   PROCEDURE: UNABOOT APPLIED TO B LOWER EXTREMITIES. ACE BANDAGE APPLIED TO B LOWER EXTREMITIES.    Assessment & Plan:   1. Enuresis   2. Chronic renal impairment, stage 2 (mild)   3. Essential hypertension   4. Hypothyroidism due to  acquired atrophy of thyroid   5. 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 Generalized anxiety disorder   7. Venous stasis dermatitis of both lower extremities   8. Decubitus ulcer, ankle, left, unstageable (HCC)    -new onset L ankle ulceration with venous stasis dermatitis; UNABOOT placement B lower extremities; close follow-up in 2 weeks.  RTC sooner for worsening.  Rx Keflex.  Refer to wound center if no improvement or worsening. -New onset enuresis in past year; send urine culture.  -hypertension controlled; discontinue Spironolactone due to enuresis.  Obtain labs. -new onset anemia; pt declines bloody stools or melena; has been taking ferrous sulfate one daily since last visit; pt and caregiver state that patient cannot tolerate colonoscopy prep so decline colonoscopy. -chronic pruritis; will discontinue Lisinopril and start Metoprolol.  Possible allergy to ACEI; hope that B blocker may help with anxiety. -intolerant to Paxil; increase  Effexor to 225mg  daily.   -ADD Trazodone qhs for insomnia; will not increase benzo. --prolonged face-to-face for 40 minutes with greater than 50% of time dedicated to counseling and coordination of care.  Orders Placed This Encounter  Procedures  . CBC with Differential/Platelet  . Comprehensive metabolic panel  . T4, free  . TSH  . Iron  . Iron and TIBC    Meds ordered this encounter  Medications  . venlafaxine XR (EFFEXOR-XR) 75 MG 24 hr capsule    Sig: Take 3 capsules (225 mg total) by mouth daily.    Dispense:  270 capsule    Refill:  1    Please consider 90 day supplies to promote better adherence  . traZODone (DESYREL) 50 MG tablet    Sig: Take 1-2 tablets (50-100 mg total) by mouth at bedtime as needed for sleep.    Dispense:  60 tablet    Refill:  5  . metoprolol succinate (TOPROL-XL) 50 MG 24 hr tablet    Sig: Take 1 tablet (50 mg total) by mouth daily. Take with or immediately following a meal.    Dispense:  90 tablet    Refill:  1  . cephALEXin (KEFLEX) 500 MG capsule    Sig: Take 1 capsule (500 mg total) by mouth 3 (three) times daily.    Dispense:  30 capsule    Refill:  0   , M.D. [facility] at [facility] [address] [place], [place]  [place] [phone] phone [phone] fax

## 2017-02-01 NOTE — Patient Instructions (Addendum)
STOP LISINOPRIL.   STOP SPIRONOLACTONE. START METOPROLOL ER 50MG  ONE TABLET DAILY. INCREASE EFFEXOR XR TO 225MG  (THREE TABLETS DAILY) FOR ANXIETY. START TRAZODONE 50MG  1-2 AT BEDTIME FOR INSOMNIA.     IF you received an x-ray today, you will receive an invoice from [facility]. Please contact [facility] at [phone] with questions or concerns regarding your invoice.   IF you received labwork today, you will receive an invoice from [facility]. Please contact [facility] at [phone] with questions or concerns regarding your invoice.   Our billing staff will not be able to assist you with questions regarding bills from these companies.  You will be contacted with the lab results as soon as they are available. The fastest way to get your results is to activate your My Chart account. Instructions are located on the last page of this paperwork. If you have not heard from [phone] regarding the results in 2 weeks, please contact this office.

## 2017-02-02 LAB — COMPREHENSIVE METABOLIC PANEL
ALT: 16 IU/L (ref 0–32)
AST: 21 IU/L (ref 0–40)
Albumin/Globulin Ratio: 1.6 (ref 1.2–2.2)
Albumin: 4.4 g/dL (ref 3.6–4.8)
Alkaline Phosphatase: 87 IU/L (ref 39–117)
BUN/Creatinine Ratio: 24 (ref 12–28)
BUN: 33 mg/dL — ABNORMAL HIGH (ref 8–27)
Bilirubin Total: 0.3 mg/dL (ref 0.0–1.2)
CALCIUM: 8.9 mg/dL (ref 8.7–10.3)
CO2: 22 mmol/L (ref 20–29)
Chloride: 102 mmol/L (ref 96–106)
Creatinine, Ser: 1.39 mg/dL — ABNORMAL HIGH (ref 0.57–1.00)
GFR, EST AFRICAN AMERICAN: 45 mL/min/{1.73_m2} — AB (ref >59)
GFR, EST NON AFRICAN AMERICAN: 39 mL/min/{1.73_m2} — AB (ref >59)
Globulin, Total: 2.8 g/dL (ref 1.5–4.5)
Glucose: 91 mg/dL (ref 65–99)
POTASSIUM: 5 mmol/L (ref 3.5–5.2)
Sodium: 139 mmol/L (ref 134–144)
Total Protein: 7.2 g/dL (ref 6.0–8.5)

## 2017-02-02 LAB — IRON AND TIBC
IRON SATURATION: 13 % — AB (ref 15–55)
Iron: 42 ug/dL (ref 27–139)
Total Iron Binding Capacity: 325 ug/dL (ref 250–450)
UIBC: 283 ug/dL (ref 118–369)

## 2017-02-02 LAB — CBC WITH DIFFERENTIAL/PLATELET
Basophils Absolute: 0.1 10*3/uL (ref 0.0–0.2)
Basos: 1 %
EOS (ABSOLUTE): 0.5 10*3/uL — ABNORMAL HIGH (ref 0.0–0.4)
EOS: 6 %
HEMATOCRIT: 33.2 % — AB (ref 34.0–46.6)
Hemoglobin: 11 g/dL — ABNORMAL LOW (ref 11.1–15.9)
IMMATURE GRANULOCYTES: 0 %
Immature Grans (Abs): 0 10*3/uL (ref 0.0–0.1)
LYMPHS ABS: 1.5 10*3/uL (ref 0.7–3.1)
Lymphs: 18 %
MCH: 26.9 pg (ref 26.6–33.0)
MCHC: 33.1 g/dL (ref 31.5–35.7)
MCV: 81 fL (ref 79–97)
MONOS ABS: 0.7 10*3/uL (ref 0.1–0.9)
Monocytes: 9 %
NEUTROS PCT: 66 %
Neutrophils Absolute: 5.5 10*3/uL (ref 1.4–7.0)
Platelets: 294 10*3/uL (ref 150–379)
RBC: 4.09 x10E6/uL (ref 3.77–5.28)
RDW: 15.8 % — AB (ref 12.3–15.4)
WBC: 8.3 10*3/uL (ref 3.4–10.8)

## 2017-02-02 LAB — TSH: TSH: 4 u[IU]/mL (ref 0.450–4.500)

## 2017-02-02 LAB — T4, FREE: Free T4: 1.25 ng/dL (ref 0.82–1.77)

## 2017-02-25 ENCOUNTER — Telehealth: Payer: Self-pay | Admitting: Family Medicine

## 2017-02-25 NOTE — Telephone Encounter (Signed)
Pt is calling to check on the status of her refill for Effexor.  [name] is supposed to up the dosage to 225mg  and pt is currently out of medication.  Pt uses the on [employer].  Please advise [phone]

## 2017-02-26 ENCOUNTER — Other Ambulatory Visit: Payer: Self-pay | Admitting: Family Medicine

## 2017-02-26 DIAGNOSIS — F411 Generalized anxiety disorder: Secondary | ICD-10-CM

## 2017-02-26 NOTE — Telephone Encounter (Signed)
Called patient but unable to leave message. Pt received a 59month supply on 02/01/17.

## 2017-02-28 ENCOUNTER — Telehealth: Payer: Self-pay | Admitting: Family Medicine

## 2017-02-28 NOTE — Telephone Encounter (Signed)
Please Advise

## 2017-02-28 NOTE — Telephone Encounter (Signed)
Pt called in asking about the medication Effexor & says that the pharmacy told her that the ins is not paying for the medication.  Please Advise

## 2017-02-28 NOTE — Telephone Encounter (Signed)
Patients insurance will only pay for 1 tablet a day not three so the RX needs to be changed and resent to the pharmacy.   Please call [phone]--Christy Luna for more information if needed. Thank you

## 2017-02-28 NOTE — Telephone Encounter (Signed)
PARTNER CALLED STATING INSURANCE IS NOT COVERING RX AS WRITTEN// REQ. 1XDAY 225 MG// SHE HAS 1 DOSES LEFT   [phone] PLEASE CALL

## 2017-03-01 ENCOUNTER — Telehealth: Payer: Self-pay | Admitting: *Deleted

## 2017-03-01 MED ORDER — VENLAFAXINE HCL ER 150 MG PO CP24: 150.0000 mg | ORAL_CAPSULE | Freq: Every day | ORAL | 1 refills | Status: AC

## 2017-03-01 NOTE — Telephone Encounter (Signed)
Carollee Herter from [facility] called and stated that pts insurance will not cover Effexor but will cover generic Venlafaxine. Ok to give Venlafaxine 225 mg, 1 tablet daily #30 with 3 refills.

## 2017-03-01 NOTE — Telephone Encounter (Signed)
Dr. Smith, please see this message. Thanks. 

## 2017-03-01 NOTE — Addendum Note (Signed)
Addended by: Ethelda Chick on: 03/01/2017 02:58 PM   Modules accepted: Orders

## 2017-03-02 NOTE — Telephone Encounter (Signed)
rx reodered on 03/01/17, pt advised.

## 2017-03-05 NOTE — Telephone Encounter (Signed)
Effexor XR 150mg  one capsule daily sent to pharmacy since insurance will only pay for one daily.  Please advise pt.

## 2017-03-07 NOTE — Telephone Encounter (Signed)
Attempted to reach patient with message. No vm set up

## 2017-04-04 ENCOUNTER — Telehealth: Payer: Self-pay | Admitting: Family Medicine

## 2017-04-04 NOTE — Telephone Encounter (Signed)
[facility] CNA THAT GOES TO PATIENTS HOUSE EACH DAY STATES THAT PT RIGHT LEG IS SWOLLEN AND THAT DR [name] TOOK PT OFF OF LISINOPRIL FOR ITCHING SHE STATES THAT ITS STILL NOT HELPING CAUSE PT IS STILL ITCHING PLEASE CALL CNA [facility] AT [phone]

## 2017-04-09 NOTE — Telephone Encounter (Signed)
Call --- did the UNABOOT help at visit in 01/2017?  Does she still have the ulcer on the ankle/leg?  If yes, I recommend reevaluation in upcoming 1-2 weeks with me.  What is blood pressure running?

## 2017-04-13 NOTE — Telephone Encounter (Signed)
Call to [facility] CNA.  She states that UNABOOT did help patient. Her ulcer is healing nicely and is almost completely gone.  Her  Blood pressure is also doing well. The only issue she sees with the patient is the swelling in her right leg.  She said at one time the patient was on a water pill, only taking one half tablet.  Christy Luna is wondering if she can re-start that medication.  I told her that I would ask Dr. [name] and someone would call her back.  She seemed very happy about this.

## 2017-04-15 MED ORDER — FUROSEMIDE 20 MG PO TABS: 20.0000 mg | ORAL_TABLET | Freq: Every day | ORAL | 3 refills | Status: AC | PRN

## 2017-04-15 NOTE — Telephone Encounter (Signed)
Sent in Lasix 20mg  one daily to pharmacy for leg swelling.  Spoke with CNA regarding swelling.  Has been walking a lot.  Patient also reports that she feels funny.  120/70.  Pulse is normal. No fever.  Eating and drinking well. Has compression stockings.  When applies UNABOOT, legs really help.  Started scratching it off.  A/P: Venous stasis dermatitis:  Recommend UNABOOT PRN; also recommend compression stockings.  UNABOOT helping flaking skin.

## 2017-04-19 ENCOUNTER — Telehealth: Payer: Self-pay | Admitting: Physician Assistant

## 2017-04-19 NOTE — Telephone Encounter (Signed)
Found down by CNA, who had last seen the patient in her usual state of health at 6:30 pm yesterday. No evidence of self harm, violence, accident observed by EMS. Appears to have died several hours ago based on lividity and temperature. Pronounced at 10:58 am.  I reviewed the record and advised that Dr. [name] will sign the death certificate. If she knows of information that would change that, she can call Quita Skye (EMS) at [phone]. OK to leave a message (this is his personal cell number).

## 2017-04-21 ENCOUNTER — Telehealth: Payer: Self-pay | Admitting: Family Medicine

## 2017-04-21 NOTE — Telephone Encounter (Signed)
Documents received.

## 2017-04-21 NOTE — Telephone Encounter (Signed)
Spoke to [employer] --- fell over the weekend; Christy Luna thinks had an AMI or TIA. Denied chest pain or SOB.  Difficulties moving.  Had never complained of anything.  Hurt arm during fall. Could not see well on Dec 11, 2022.   Arrived the day of death and patient was dead in the bed.  Funeral on 05/11/17 [facility] at [place]; viewing at 4:00pm; service at 5:00pm.

## 2017-04-21 NOTE — Telephone Encounter (Signed)
Pt's death certificate has been left in your box to complete.

## 2017-04-25 NOTE — Telephone Encounter (Signed)
Death certificate completed on 04/22/17 and given to Shannon/Clerical TL to contact funeral home for pick up.

## 2017-05-09 DIAGNOSIS — 419620001 Death: Secondary | SNOMED CT | POA: Diagnosis not present

## 2017-05-09 DEATH — deceased
